# Patient Record
Sex: Male | Born: 1940 | Race: White | Hispanic: No | Marital: Married | Smoking: Former smoker
Health system: Southern US, Community
[De-identification: ages and names within clinical notes are randomized; demographics above are authoritative.]

## PROBLEM LIST (undated history)

## (undated) DIAGNOSIS — Z923 Personal history of irradiation: Secondary | ICD-10-CM

## (undated) DIAGNOSIS — C801 Malignant (primary) neoplasm, unspecified: Secondary | ICD-10-CM

## (undated) DIAGNOSIS — R590 Localized enlarged lymph nodes: Secondary | ICD-10-CM

## (undated) DIAGNOSIS — Z973 Presence of spectacles and contact lenses: Secondary | ICD-10-CM

## (undated) DIAGNOSIS — C61 Malignant neoplasm of prostate: Secondary | ICD-10-CM

## (undated) DIAGNOSIS — R0602 Shortness of breath: Secondary | ICD-10-CM

## (undated) DIAGNOSIS — M1990 Unspecified osteoarthritis, unspecified site: Secondary | ICD-10-CM

## (undated) DIAGNOSIS — C7951 Secondary malignant neoplasm of bone: Secondary | ICD-10-CM

## (undated) DIAGNOSIS — J189 Pneumonia, unspecified organism: Secondary | ICD-10-CM

## (undated) DIAGNOSIS — E785 Hyperlipidemia, unspecified: Secondary | ICD-10-CM

## (undated) HISTORY — PX: TONSILLECTOMY: SUR1361

## (undated) HISTORY — DX: Personal history of irradiation: Z92.3

## (undated) HISTORY — DX: Secondary malignant neoplasm of bone: C79.51

---

## 1970-03-29 HISTORY — PX: HERNIA REPAIR: SHX51

## 1997-03-29 HISTORY — PX: KNEE ARTHROSCOPY: SUR90

## 1999-03-30 HISTORY — PX: KNEE ARTHROSCOPY: SUR90

## 1999-05-21 ENCOUNTER — Encounter: Admission: RE | Admit: 1999-05-21 | Discharge: 1999-05-21 | Payer: Self-pay | Admitting: Orthopedic Surgery

## 1999-05-21 ENCOUNTER — Encounter: Payer: Self-pay | Admitting: Orthopedic Surgery

## 1999-06-22 ENCOUNTER — Ambulatory Visit (HOSPITAL_BASED_OUTPATIENT_CLINIC_OR_DEPARTMENT_OTHER): Admission: RE | Admit: 1999-06-22 | Discharge: 1999-06-22 | Payer: Self-pay | Admitting: Orthopedic Surgery

## 2003-03-30 DIAGNOSIS — C61 Malignant neoplasm of prostate: Secondary | ICD-10-CM

## 2003-03-30 HISTORY — PX: TRANSPERINEAL IMPLANT OF RADIATION SEEDS W/ ULTRASOUND: SUR1381

## 2003-03-30 HISTORY — DX: Malignant neoplasm of prostate: C61

## 2003-08-27 ENCOUNTER — Ambulatory Visit: Admission: RE | Admit: 2003-08-27 | Discharge: 2003-11-22 | Payer: Self-pay | Admitting: Radiation Oncology

## 2003-09-03 ENCOUNTER — Encounter: Admission: RE | Admit: 2003-09-03 | Discharge: 2003-09-03 | Payer: Self-pay | Admitting: Urology

## 2003-10-03 ENCOUNTER — Ambulatory Visit (HOSPITAL_COMMUNITY): Admission: RE | Admit: 2003-10-03 | Discharge: 2003-10-03 | Payer: Self-pay | Admitting: Urology

## 2003-10-03 ENCOUNTER — Ambulatory Visit (HOSPITAL_BASED_OUTPATIENT_CLINIC_OR_DEPARTMENT_OTHER): Admission: RE | Admit: 2003-10-03 | Discharge: 2003-10-03 | Payer: Self-pay | Admitting: Urology

## 2006-11-16 ENCOUNTER — Encounter: Admission: RE | Admit: 2006-11-16 | Discharge: 2006-11-16 | Payer: Self-pay | Admitting: Sports Medicine

## 2007-10-22 ENCOUNTER — Emergency Department (HOSPITAL_COMMUNITY): Admission: EM | Admit: 2007-10-22 | Discharge: 2007-10-23 | Payer: Self-pay | Admitting: Emergency Medicine

## 2009-04-02 ENCOUNTER — Ambulatory Visit (HOSPITAL_COMMUNITY): Admission: RE | Admit: 2009-04-02 | Discharge: 2009-04-02 | Payer: Self-pay | Admitting: Urology

## 2010-06-10 ENCOUNTER — Other Ambulatory Visit (INDEPENDENT_AMBULATORY_CARE_PROVIDER_SITE_OTHER): Payer: Self-pay

## 2010-06-10 DIAGNOSIS — R0989 Other specified symptoms and signs involving the circulatory and respiratory systems: Secondary | ICD-10-CM

## 2010-08-14 NOTE — Op Note (Signed)
[place]  Patient:    Donald Blair, Donald Blair                    MRN: [id number] Proc. Date: 06/22/99 Adm. Date:  [id number] Attending:  Twana First                           Operative Report  PREOPERATIVE DIAGNOSIS:  Left knee medial meniscus tear.  POSTOPERATIVE DIAGNOSIS: 1. Left knee medial meniscus tear. 2. 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  OPERATION PERFORMED:  Left knee examination under anesthesia followed by arthroscopic partial medial meniscectomy with chondroplasty.  SURGEON:  Elana Alm. Thurston Hole, M.D.  ASSISTANT:  Kirstin Adelberger, P.A.  ANESTHESIA:  Local with MAC.  OPERATIVE TIME:  30 minutes.  COMPLICATIONS:  None.  INDICATIONS FOR PROCEDURE:  The patient is a 70 year old gentleman who has had increasing left knee pain for the past eight to nine months with signs and symptoms and an MRI documenting a medial meniscus tear and early degenerative joint disease that has failed conservative care and is now to undergo arthroscopy.  DESCRIPTION OF PROCEDURE:  Mr. Limb was brought to the operating room on June 22, 1999 after a block had been placed in the holding room.  Placed on the operating table in supine position.  The left knee was examined under anesthesia. Range of motion 0 to 125 degrees, 1+ crepitation, mild varus deformity.  Knee stable otherwise to ligamentous exam, anterior posterior varus valgus stress, normal patellar tracking.  After this was done, the knee was prepped using sterile Betadine and draped using sterile technique.  Originally through an inferior portal the arthroscope with a pump attached was placed and through an inferior medial portal an arthroscopic probe was placed.  On initial inspection of the medial compartment, he was found to have 75% grade 3 chondromalacia of the medial femoral condyle which was debrided.  The medial tibial plateau showed only  20% grade 3 changes, the rest grade 1 to 2 changes.  The medial meniscus showed tearing posterior medial horn of which 40 to 50% was resected back to a stable rim. The intercondylar notch inspected.  The anterior and posterior cruciate ligaments were found to be normal.  Lateral compartment inspected.  Articular cartilage, lateral femoral condyle and lateral tibial plateau was normal.  Lateral meniscus was normal.  The patellofemoral joint showed 30% grade 3 chondromalacia which was debrided.  The patella tracked normally.  Significant synovitis medially and laterally was debrided and then cauterized.  Otherwise the medial and lateral gutters were free of pathology.  After this was done, it was felt that all pathology had been satisfactorily addressed.  The instruments were removed. Portals were closed with 3-0 nylon suture and injected with 0.25% Marcaine with  epinephrine and 5 mg of morphine.  Sterile dressing was applied and the patient  awakened and taken to the recovery room in stable condition.  FOLLOW-UP:  Mr. Matuszak will be followed as an outpatient on Vicodin and Naprosyn. See him back in the office in a week for suture removal and follow-up. DD:  06/22/99 TD:  06/22/99 Job: [id number]

## 2010-08-14 NOTE — Op Note (Signed)
NAME:  Donald Blair, Donald Blair                       ACCOUNT NO.:  [id number]   MEDICAL RECORD NO.:  [id number]                   PATIENT TYPE:  AMB   LOCATION:  [facility]                                 FACILITY:  [facility]   PHYSICIAN:  Claudette Laws, M.D.               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   POSTOPERATIVE DIAGNOSIS:  Clinical stage T1C Gleason grade 7 carcinoma of  the prostate gland.   OPERATION:  1. Transperineal radioactive seed implantation to the prostate gland.  2. Cystoscopy.   SURGEON:  Claudette Laws, M.D.   DESCRIPTION OF PROCEDURE:  The patient was prepped and draped in the dorsal  lithotomy position under intubated general anesthesia.  Foley catheter was  placed as was a Robinson catheter per rectum.  Dr. Chipper Herb, the  radiation oncologist, placed the transrectal probe, and the prostate was  then contoured.  We then placed anchor needles at C3.0 and E3.0.  We then  proceeded to implant the needles according to the study.  There were a total  of 18 needles for a total of 88 seeds.  The procedure went well using  ultrasound and C-arm for control purposes.  At the conclusion, the catheter  was removed.  Rigid cystoscopy was performed showing a normal anterior  urethra, enlarged prostate consistent with BPH and a normal bladder.  No  tumors no calculi, and normal ureteral orifices, and no seeds were in the  bladder.  We then replaced the Foley 16 [place], hooked it to a catheter, and  the patient was then taken back to the recovery room in satisfactory  condition.                                               Claudette Laws, M.D.    RFS/MEDQ  D:  10/03/2003  T:  10/03/2003  Job:  [id number]

## 2010-12-25 LAB — POCT I-STAT, CHEM 8
BUN: 17
Calcium, Ion: 1.3
Chloride: 103
Creatinine, Ser: 1.1
Glucose, Bld: 104 — ABNORMAL HIGH
HCT: 50
Hemoglobin: 17
Potassium: 4
Sodium: 139
TCO2: 28

## 2010-12-25 LAB — DIFFERENTIAL
Basophils Absolute: 0
Basophils Relative: 0
Eosinophils Absolute: 0.3
Eosinophils Relative: 2
Lymphocytes Relative: 10 — ABNORMAL LOW
Lymphs Abs: 1.4
Monocytes Absolute: 0.6
Monocytes Relative: 5
Neutro Abs: 11.6 — ABNORMAL HIGH
Neutrophils Relative %: 83 — ABNORMAL HIGH

## 2010-12-25 LAB — BASIC METABOLIC PANEL
BUN: 14
CO2: 28
Calcium: 10
Chloride: 106
Creatinine, Ser: 0.93
GFR calc Af Amer: >60
GFR calc non Af Amer: >60
Glucose, Bld: 103 — ABNORMAL HIGH
Potassium: 4.2
Sodium: 139

## 2010-12-25 LAB — HEPATIC FUNCTION PANEL
ALT: 26
AST: 21
Albumin: 3.9
Alkaline Phosphatase: 58
Bilirubin, Direct: 0.3
Indirect Bilirubin: 0.8
Total Bilirubin: 1.1
Total Protein: 6.8

## 2010-12-25 LAB — CBC
MCHC: 33.5
MCV: 92
Platelets: 218
RBC: 5.35

## 2010-12-25 LAB — LIPASE, BLOOD: Lipase: 27

## 2011-07-12 ENCOUNTER — Other Ambulatory Visit (HOSPITAL_COMMUNITY): Payer: Self-pay | Admitting: Urology

## 2011-07-12 DIAGNOSIS — C61 Malignant neoplasm of prostate: Secondary | ICD-10-CM

## 2011-07-27 ENCOUNTER — Ambulatory Visit (HOSPITAL_COMMUNITY)
Admission: RE | Admit: 2011-07-27 | Discharge: 2011-07-27 | Disposition: A | Discharge status: Home / Self Care | Payer: Medicare Other | Source: Ambulatory Visit | Attending: Urology | Admitting: Urology

## 2011-07-27 ENCOUNTER — Encounter (HOSPITAL_COMMUNITY)
Admission: RE | Admit: 2011-07-27 | Discharge: 2011-07-27 | Disposition: A | Discharge status: Home / Self Care | Payer: 59 | Source: Ambulatory Visit | Attending: Urology | Admitting: Urology

## 2011-07-27 ENCOUNTER — Other Ambulatory Visit (HOSPITAL_COMMUNITY): Payer: Self-pay | Admitting: Urology

## 2011-07-27 DIAGNOSIS — C7951 Secondary malignant neoplasm of bone: Secondary | ICD-10-CM | POA: Insufficient documentation

## 2011-07-27 DIAGNOSIS — R0602 Shortness of breath: Secondary | ICD-10-CM | POA: Insufficient documentation

## 2011-07-27 DIAGNOSIS — C61 Malignant neoplasm of prostate: Secondary | ICD-10-CM

## 2011-07-27 DIAGNOSIS — Z006 Encounter for examination for normal comparison and control in clinical research program: Secondary | ICD-10-CM | POA: Insufficient documentation

## 2011-07-27 DIAGNOSIS — Z01818 Encounter for other preprocedural examination: Secondary | ICD-10-CM | POA: Insufficient documentation

## 2011-07-27 DIAGNOSIS — C7952 Secondary malignant neoplasm of bone marrow: Secondary | ICD-10-CM | POA: Insufficient documentation

## 2011-07-27 MED ORDER — TECHNETIUM TC 99M MEDRONATE IV KIT
25.0000 | PACK | Freq: Once | INTRAVENOUS | Status: AC | PRN
  Administered 2011-07-27: 25 via INTRAVENOUS

## 2011-07-28 ENCOUNTER — Encounter: Payer: Self-pay | Admitting: Internal Medicine

## 2011-08-11 ENCOUNTER — Encounter: Payer: Self-pay | Admitting: Internal Medicine

## 2011-09-21 ENCOUNTER — Other Ambulatory Visit (HOSPITAL_COMMUNITY): Payer: Self-pay | Admitting: Urology

## 2011-09-21 DIAGNOSIS — C61 Malignant neoplasm of prostate: Secondary | ICD-10-CM

## 2011-10-27 ENCOUNTER — Ambulatory Visit (HOSPITAL_COMMUNITY): Payer: Self-pay

## 2011-10-27 ENCOUNTER — Encounter (HOSPITAL_COMMUNITY)
Admission: RE | Admit: 2011-10-27 | Discharge: 2011-10-27 | Disposition: A | Discharge status: Home / Self Care | Payer: 59 | Source: Ambulatory Visit | Attending: Urology | Admitting: Urology

## 2011-10-27 ENCOUNTER — Encounter (HOSPITAL_COMMUNITY): Payer: Self-pay

## 2011-10-27 DIAGNOSIS — M899 Disorder of bone, unspecified: Secondary | ICD-10-CM | POA: Insufficient documentation

## 2011-10-27 DIAGNOSIS — M949 Disorder of cartilage, unspecified: Secondary | ICD-10-CM | POA: Insufficient documentation

## 2011-10-27 DIAGNOSIS — C61 Malignant neoplasm of prostate: Secondary | ICD-10-CM | POA: Insufficient documentation

## 2011-10-27 MED ORDER — TECHNETIUM TC 99M MEDRONATE IV KIT
24.0000 | PACK | Freq: Once | INTRAVENOUS | Status: AC | PRN
  Administered 2011-10-27: 24 via INTRAVENOUS

## 2011-11-10 ENCOUNTER — Other Ambulatory Visit (HOSPITAL_COMMUNITY): Payer: Self-pay | Admitting: Urology

## 2011-11-10 DIAGNOSIS — C61 Malignant neoplasm of prostate: Secondary | ICD-10-CM

## 2012-01-19 ENCOUNTER — Encounter (HOSPITAL_COMMUNITY)
Admission: RE | Admit: 2012-01-19 | Discharge: 2012-01-19 | Disposition: A | Discharge status: Home / Self Care | Payer: Medicare Other | Source: Ambulatory Visit | Attending: Urology | Admitting: Urology

## 2012-01-19 ENCOUNTER — Encounter (HOSPITAL_COMMUNITY): Payer: Self-pay

## 2012-01-19 DIAGNOSIS — C61 Malignant neoplasm of prostate: Secondary | ICD-10-CM | POA: Insufficient documentation

## 2012-01-19 MED ORDER — TECHNETIUM TC 99M MEDRONATE IV KIT
24.0000 | PACK | Freq: Once | INTRAVENOUS | Status: AC | PRN
  Administered 2012-01-19: 24 via INTRAVENOUS

## 2012-02-02 ENCOUNTER — Other Ambulatory Visit (HOSPITAL_COMMUNITY): Payer: Self-pay | Admitting: Urology

## 2012-02-02 DIAGNOSIS — C61 Malignant neoplasm of prostate: Secondary | ICD-10-CM

## 2012-04-12 ENCOUNTER — Encounter (HOSPITAL_COMMUNITY)
Admission: RE | Admit: 2012-04-12 | Discharge: 2012-04-12 | Disposition: A | Discharge status: Home / Self Care | Payer: 59 | Source: Ambulatory Visit | Attending: Urology | Admitting: Urology

## 2012-04-12 ENCOUNTER — Encounter (HOSPITAL_COMMUNITY)
Admission: RE | Admit: 2012-04-12 | Discharge: 2012-04-12 | Disposition: A | Discharge status: Home / Self Care | Payer: Self-pay | Source: Ambulatory Visit | Attending: Urology | Admitting: Urology

## 2012-04-12 DIAGNOSIS — C61 Malignant neoplasm of prostate: Secondary | ICD-10-CM | POA: Insufficient documentation

## 2012-04-12 MED ORDER — TECHNETIUM TC 99M MEDRONATE IV KIT
25.0000 | PACK | Freq: Once | INTRAVENOUS | Status: AC | PRN
  Administered 2012-04-12: 25 via INTRAVENOUS

## 2012-05-16 ENCOUNTER — Other Ambulatory Visit (HOSPITAL_COMMUNITY): Payer: Self-pay | Admitting: Urology

## 2012-05-16 DIAGNOSIS — C61 Malignant neoplasm of prostate: Secondary | ICD-10-CM

## 2012-07-12 ENCOUNTER — Encounter (HOSPITAL_COMMUNITY): Payer: 59

## 2012-09-19 ENCOUNTER — Other Ambulatory Visit: Payer: Self-pay | Admitting: Orthopedic Surgery

## 2012-09-27 ENCOUNTER — Encounter (HOSPITAL_BASED_OUTPATIENT_CLINIC_OR_DEPARTMENT_OTHER): Payer: Self-pay | Admitting: *Deleted

## 2012-09-27 NOTE — Progress Notes (Signed)
09/27/12 1019  OBSTRUCTIVE SLEEP APNEA  Have you ever been diagnosed with sleep apnea through a sleep study? No  Do you snore loudly (loud enough to be heard through closed doors)?  1  Do you often feel tired, fatigued, or sleepy during the daytime? 0  Has anyone observed you stop breathing during your sleep? 0  Do you have, or are you being treated for high blood pressure? 0  BMI more than 35 kg/m2? 1  Age over 74 years old? 1  Gender: 1  Obstructive Sleep Apnea Score 4  Score 4 or greater  Results sent to PCP

## 2012-09-27 NOTE — Progress Notes (Signed)
No cardiav or resp problems-some sob due to overweight To bring all meds and overnight bag

## 2012-10-02 NOTE — H&P (Signed)
Donald Blair is an 72 y.o. male.   Chief Complaint: c/o chronic and progressive pain and weakness right shoulder HPI: Donald Blair is a 72 year old retired gentleman referred by Dr. Quita Blair office for evaluation and management of his impaired right arm. 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 He is in the middle of treatment for recurrent prostate cancer. He had prostate cancer diagnosed in 2002 and had seed implants. Unfortunately he has had recurrence. He has been treated with Provenge immunotherapy. His urologist is Donald Blair. He sustained a significant injury to his right arm in a fall in mid January 2014. He had a significant direct blow to the right elbow region and load bearing to the shoulder. He had pain in the shoulder and inability to abduct or forward flex the shoulder since the time of injury. He has gradually regained some motion. He now seeks an upper extremity orthopaedic consult.   Past Medical History  Diagnosis Date  . Cancer     prostate  . Hyperlipidemia   . Wears glasses   . Shortness of breath   . Arthritis     Past Surgical History  Procedure Laterality Date  . Tonsillectomy    . Hernia repair  1972    lt ing  . Knee arthroscopy  1999    right  . Knee arthroscopy  2001    left  . Transperineal implant of radiation seeds w/ ultrasound  2005    prostate    History reviewed. No pertinent family history. Social History:  reports that he has quit smoking. He quit smokeless tobacco use about 40 years ago. He reports that he does not drink alcohol or use illicit drugs.  Allergies: No Known Allergies  No prescriptions prior to admission    No results found for this or any previous visit (from the past 48 hour(s)).  No results found.   Pertinent items are noted in HPI.  Height 6' (1.829 m), weight 127.007 kg (280 lb).  General appearance: alert Head: Normocephalic, without obvious  abnormality Neck: supple, symmetrical, trachea midline Resp: clear to auscultation bilaterally Cardio: regular rate and rhythm GI: normal findings: bowel sounds normal Extremities:  His shoulder ROM reveals impairment of elevation right shoulder at 150 degrees vs 170 on the left. He has external rotation 80 degrees at 90 degrees abduction vs 90 degrees on the left. He can internally rotate to L4 vs approximately T12 on the left. He has weakness of scaption. He has a positive drop sign, weakness of external rotation at neutral and at 90 degrees abduction.   He had x-rays at the [facility] practice which revealed no sign of fracture.   The MRI was obtained at [facility] on 09/12/12. This reveals a large right rotator cuff tear involving the supraspinatus, infraspinatus tendons, it is retracted.   He has an intact subscapularis and mildly unfavorable AC anatomy.    Pulses: 2+ and symmetric Skin: normal Neurologic: Grossly normal    Assessment/Plan Impression: Right shoulder impingement with RC tear confirmed by MRI.  Plan: To the OR for right SA with SAD/DCR and repair RC as needed.The procedure, risks,benefits and post-op course were discussed with the patient at length and they were in agreement with the plan.  Donald Blair 10/02/2012, 4:01 PM   H&P documentation: 10/03/2012  -History and Physical Reviewed  -Patient has been re-examined  -No change in the plan of care  Donald Forster, MD

## 2012-10-03 ENCOUNTER — Encounter (HOSPITAL_BASED_OUTPATIENT_CLINIC_OR_DEPARTMENT_OTHER)
Admission: RE | Disposition: A | Discharge status: Home / Self Care | Payer: Self-pay | Source: Ambulatory Visit | Attending: Orthopedic Surgery

## 2012-10-03 ENCOUNTER — Encounter (HOSPITAL_BASED_OUTPATIENT_CLINIC_OR_DEPARTMENT_OTHER): Payer: Self-pay | Admitting: *Deleted

## 2012-10-03 ENCOUNTER — Ambulatory Visit (HOSPITAL_BASED_OUTPATIENT_CLINIC_OR_DEPARTMENT_OTHER): Payer: Medicare Other | Admitting: Certified Registered"

## 2012-10-03 ENCOUNTER — Encounter (HOSPITAL_BASED_OUTPATIENT_CLINIC_OR_DEPARTMENT_OTHER): Payer: Self-pay | Admitting: Certified Registered"

## 2012-10-03 ENCOUNTER — Ambulatory Visit (HOSPITAL_BASED_OUTPATIENT_CLINIC_OR_DEPARTMENT_OTHER)
Admission: RE | Admit: 2012-10-03 | Discharge: 2012-10-04 | Disposition: A | Discharge status: Home / Self Care | Payer: Medicare Other | Source: Ambulatory Visit | Attending: Orthopedic Surgery | Admitting: Orthopedic Surgery

## 2012-10-03 DIAGNOSIS — M9420 Chondromalacia, unspecified site: Secondary | ICD-10-CM | POA: Insufficient documentation

## 2012-10-03 DIAGNOSIS — Z923 Personal history of irradiation: Secondary | ICD-10-CM | POA: Insufficient documentation

## 2012-10-03 DIAGNOSIS — M129 Arthropathy, unspecified: Secondary | ICD-10-CM | POA: Insufficient documentation

## 2012-10-03 DIAGNOSIS — Z6838 Body mass index (BMI) 38.0-38.9, adult: Secondary | ICD-10-CM | POA: Insufficient documentation

## 2012-10-03 DIAGNOSIS — C61 Malignant neoplasm of prostate: Secondary | ICD-10-CM | POA: Insufficient documentation

## 2012-10-03 DIAGNOSIS — W19XXXA Unspecified fall, initial encounter: Secondary | ICD-10-CM | POA: Insufficient documentation

## 2012-10-03 DIAGNOSIS — Z87891 Personal history of nicotine dependence: Secondary | ICD-10-CM | POA: Insufficient documentation

## 2012-10-03 DIAGNOSIS — E785 Hyperlipidemia, unspecified: Secondary | ICD-10-CM | POA: Insufficient documentation

## 2012-10-03 DIAGNOSIS — M25819 Other specified joint disorders, unspecified shoulder: Secondary | ICD-10-CM | POA: Insufficient documentation

## 2012-10-03 DIAGNOSIS — S43429A Sprain of unspecified rotator cuff capsule, initial encounter: Secondary | ICD-10-CM | POA: Insufficient documentation

## 2012-10-03 DIAGNOSIS — M19019 Primary osteoarthritis, unspecified shoulder: Secondary | ICD-10-CM | POA: Insufficient documentation

## 2012-10-03 HISTORY — DX: Hyperlipidemia, unspecified: E78.5

## 2012-10-03 HISTORY — DX: Unspecified osteoarthritis, unspecified site: M19.90

## 2012-10-03 HISTORY — PX: SHOULDER ARTHROSCOPY WITH ROTATOR CUFF REPAIR: SHX5685

## 2012-10-03 HISTORY — DX: Shortness of breath: R06.02

## 2012-10-03 HISTORY — DX: Presence of spectacles and contact lenses: Z97.3

## 2012-10-03 HISTORY — DX: Malignant (primary) neoplasm, unspecified: C80.1

## 2012-10-03 SURGERY — ARTHROSCOPY, SHOULDER, WITH ROTATOR CUFF REPAIR
Anesthesia: General | Site: Shoulder | Laterality: Right | Wound class: Clean

## 2012-10-03 MED ORDER — LIDOCAINE HCL 4 % MT SOLN
OROMUCOSAL | Status: DC | PRN
  Administered 2012-10-03: 4 mL via TOPICAL

## 2012-10-03 MED ORDER — ONDANSETRON HCL 4 MG PO TABS: 4.0000 mg | ORAL_TABLET | Freq: Four times a day (QID) | ORAL | Status: DC | PRN

## 2012-10-03 MED ORDER — HYDROMORPHONE HCL PF 1 MG/ML IJ SOLN
0.2500 mg | INTRAMUSCULAR | Status: AC | PRN
  Administered 2012-10-03 – 2012-10-04 (×9): 0.5 mg via INTRAVENOUS

## 2012-10-03 MED ORDER — OXYCODONE HCL 5 MG PO TABS: 5.0000 mg | ORAL_TABLET | Freq: Once | ORAL | Status: AC | PRN

## 2012-10-03 MED ORDER — ONDANSETRON HCL 4 MG/2ML IJ SOLN: 4.0000 mg | Freq: Four times a day (QID) | INTRAMUSCULAR | Status: DC | PRN

## 2012-10-03 MED ORDER — HYDROMORPHONE HCL 2 MG PO TABS
2.0000 mg | ORAL_TABLET | ORAL | Status: DC | PRN
  Administered 2012-10-03 – 2012-10-04 (×4): 2 mg via ORAL

## 2012-10-03 MED ORDER — HYDROMORPHONE HCL 2 MG PO TABS: 2.0000 mg | ORAL_TABLET | ORAL | Status: DC | PRN

## 2012-10-03 MED ORDER — OXYCODONE HCL 5 MG/5ML PO SOLN: 5.0000 mg | Freq: Once | ORAL | Status: AC | PRN

## 2012-10-03 MED ORDER — METOCLOPRAMIDE HCL 5 MG PO TABS: 5.0000 mg | ORAL_TABLET | Freq: Three times a day (TID) | ORAL | Status: DC | PRN

## 2012-10-03 MED ORDER — PROPOFOL 10 MG/ML IV BOLUS
INTRAVENOUS | Status: DC | PRN
  Administered 2012-10-03: 50 mg via INTRAVENOUS
  Administered 2012-10-03: 200 mg via INTRAVENOUS

## 2012-10-03 MED ORDER — CEPHALEXIN 500 MG PO CAPS: 500.0000 mg | ORAL_CAPSULE | Freq: Three times a day (TID) | ORAL | Status: DC

## 2012-10-03 MED ORDER — MIDAZOLAM HCL 2 MG/2ML IJ SOLN
1.0000 mg | INTRAMUSCULAR | Status: DC | PRN
  Administered 2012-10-03: 2 mg via INTRAVENOUS

## 2012-10-03 MED ORDER — DEXAMETHASONE SODIUM PHOSPHATE 4 MG/ML IJ SOLN
INTRAMUSCULAR | Status: DC | PRN
  Administered 2012-10-03: 8 mg via INTRAVENOUS

## 2012-10-03 MED ORDER — METOCLOPRAMIDE HCL 5 MG/ML IJ SOLN: 5.0000 mg | Freq: Three times a day (TID) | INTRAMUSCULAR | Status: DC | PRN

## 2012-10-03 MED ORDER — CEFAZOLIN SODIUM-DEXTROSE 2-3 GM-% IV SOLR: 2.0000 g | Freq: Four times a day (QID) | INTRAVENOUS | Status: DC

## 2012-10-03 MED ORDER — FENTANYL CITRATE 0.05 MG/ML IJ SOLN
50.0000 ug | INTRAMUSCULAR | Status: DC | PRN
  Administered 2012-10-03: 100 ug via INTRAVENOUS

## 2012-10-03 MED ORDER — SODIUM CHLORIDE 0.9 % IV SOLN
INTRAVENOUS | Status: DC
  Administered 2012-10-03: 20 mL/h via INTRAVENOUS

## 2012-10-03 MED ORDER — SODIUM CHLORIDE 0.9 % IR SOLN
Status: DC | PRN
  Administered 2012-10-03: 8000 mL

## 2012-10-03 MED ORDER — LIDOCAINE HCL 1 % IJ SOLN
INTRAMUSCULAR | Status: DC | PRN
  Administered 2012-10-03: 2 mL via INTRADERMAL

## 2012-10-03 MED ORDER — LIDOCAINE HCL 1 % IJ SOLN: INTRAMUSCULAR | Status: DC | PRN

## 2012-10-03 MED ORDER — METOCLOPRAMIDE HCL 5 MG/ML IJ SOLN: 10.0000 mg | Freq: Once | INTRAMUSCULAR | Status: AC | PRN

## 2012-10-03 MED ORDER — SUCCINYLCHOLINE CHLORIDE 20 MG/ML IJ SOLN
INTRAMUSCULAR | Status: DC | PRN
  Administered 2012-10-03: 140 mg via INTRAVENOUS

## 2012-10-03 MED ORDER — PHENYLEPHRINE HCL 10 MG/ML IJ SOLN
10.0000 mg | INTRAVENOUS | Status: DC | PRN
  Administered 2012-10-03: 30 ug/min via INTRAVENOUS

## 2012-10-03 MED ORDER — LACTATED RINGERS IV SOLN
INTRAVENOUS | Status: DC
  Administered 2012-10-03 (×2): via INTRAVENOUS

## 2012-10-03 MED ORDER — FENTANYL CITRATE 0.05 MG/ML IJ SOLN
INTRAMUSCULAR | Status: DC | PRN
  Administered 2012-10-03 (×2): 25 ug via INTRAVENOUS

## 2012-10-03 MED ORDER — ROPIVACAINE HCL 5 MG/ML IJ SOLN
INTRAMUSCULAR | Status: DC | PRN
  Administered 2012-10-03: 15 mL

## 2012-10-03 MED ORDER — CEFAZOLIN SODIUM-DEXTROSE 2-3 GM-% IV SOLR
2.0000 g | Freq: Four times a day (QID) | INTRAVENOUS | Status: AC
  Administered 2012-10-03: 2 g via INTRAVENOUS

## 2012-10-03 MED ORDER — CEFAZOLIN SODIUM-DEXTROSE 2-3 GM-% IV SOLR
2.0000 g | INTRAVENOUS | Status: AC
  Administered 2012-10-03: 2 g via INTRAVENOUS

## 2012-10-03 MED ORDER — CEFAZOLIN SODIUM-DEXTROSE 2-3 GM-% IV SOLR
2.0000 g | Freq: Four times a day (QID) | INTRAVENOUS | Status: DC
  Administered 2012-10-03: 2 g via INTRAVENOUS

## 2012-10-03 MED ORDER — ONDANSETRON HCL 4 MG/2ML IJ SOLN
INTRAMUSCULAR | Status: DC | PRN
  Administered 2012-10-03: 4 mg via INTRAVENOUS

## 2012-10-03 MED ORDER — CHLORHEXIDINE GLUCONATE 4 % EX LIQD: 60.0000 mL | Freq: Once | CUTANEOUS | Status: DC

## 2012-10-03 SURGICAL SUPPLY — 92 items
ANCH SUT PUSHLCK 24X4.5 STRL (Orthopedic Implant) ×1 IMPLANT
ANCH SUT SWLK 19.1 CLS EYLT TL (Anchor) ×1 IMPLANT
ANCH SUT SWLK 19.1 CLS EYLT VT (Anchor) ×1 IMPLANT
ANCH SUT SWLK 19.1X4.75 (Anchor) ×2 IMPLANT
ANCHOR BIO SWLOCK 4.75 W/TIG (Anchor) ×1 IMPLANT
ANCHOR SUT BIO SW 4.75 W/FIB (Anchor) ×1 IMPLANT
ANCHOR SUT BIO SW 4.75X19.1 (Anchor) ×2 IMPLANT
BANDAGE ADHESIVE 1X3 (GAUZE/BANDAGES/DRESSINGS) IMPLANT
BLADE AVERAGE 25X9 (BLADE) IMPLANT
BLADE CUTTER MENIS 5.5 (BLADE) IMPLANT
BLADE SURG 15 STRL LF DISP TIS (BLADE) ×2 IMPLANT
BLADE SURG 15 STRL SS (BLADE) ×4
BUR EGG 3PK/BX (BURR) ×1 IMPLANT
BUR OVAL 6.0 (BURR) ×2 IMPLANT
CANISTER OMNI JUG 16 LITER (MISCELLANEOUS) ×2 IMPLANT
CANISTER SUCTION 2500CC (MISCELLANEOUS) ×1 IMPLANT
CANNULA SHOULDER 7CM (CANNULA) IMPLANT
CANNULA TWIST IN 8.25X7CM (CANNULA) IMPLANT
CLEANER CAUTERY TIP 5X5 PAD (MISCELLANEOUS) IMPLANT
CLOTH BEACON ORANGE TIMEOUT ST (SAFETY) ×2 IMPLANT
CUTTER MENISCUS  4.2MM (BLADE) ×1
CUTTER MENISCUS 4.2MM (BLADE) ×1 IMPLANT
DECANTER SPIKE VIAL GLASS SM (MISCELLANEOUS) IMPLANT
DRAPE INCISE IOBAN 66X45 STRL (DRAPES) ×2 IMPLANT
DRAPE STERI 35X30 U-POUCH (DRAPES) ×2 IMPLANT
DRAPE SURG 17X23 STRL (DRAPES) ×2 IMPLANT
DRAPE U-SHAPE 47X51 STRL (DRAPES) ×2 IMPLANT
DRAPE U-SHAPE 76X120 STRL (DRAPES) ×4 IMPLANT
DRSG PAD ABDOMINAL 8X10 ST (GAUZE/BANDAGES/DRESSINGS) ×2 IMPLANT
DURAPREP 26ML APPLICATOR (WOUND CARE) ×2 IMPLANT
ELECT REM PT RETURN 9FT ADLT (ELECTROSURGICAL) ×2
ELECTRODE REM PT RTRN 9FT ADLT (ELECTROSURGICAL) IMPLANT
GLOVE BIO SURGEON STRL SZ 6.5 (GLOVE) ×3 IMPLANT
GLOVE BIOGEL M STRL SZ7.5 (GLOVE) ×2 IMPLANT
GLOVE BIOGEL PI IND STRL 6.5 (GLOVE) IMPLANT
GLOVE BIOGEL PI IND STRL 7.0 (GLOVE) IMPLANT
GLOVE BIOGEL PI IND STRL 8 (GLOVE) ×2 IMPLANT
GLOVE BIOGEL PI INDICATOR 6.5 (GLOVE) ×1
GLOVE BIOGEL PI INDICATOR 7.0 (GLOVE) ×3
GLOVE BIOGEL PI INDICATOR 8 (GLOVE) ×2
GLOVE EXAM NITRILE EXT CUFF MD (GLOVE) ×1 IMPLANT
GLOVE ORTHO TXT STRL SZ7.5 (GLOVE) ×2 IMPLANT
GLOVE SURG SS PI 7.0 STRL IVOR (GLOVE) ×1 IMPLANT
GOWN BRE IMP PREV XXLGXLNG (GOWN DISPOSABLE) ×4 IMPLANT
GOWN PREVENTION PLUS XLARGE (GOWN DISPOSABLE) ×5 IMPLANT
NDL SCORPION (NEEDLE) ×1 IMPLANT
NDL SUT 6 .5 CRC .975X.05 MAYO (NEEDLE) IMPLANT
NEEDLE MAYO TAPER (NEEDLE) ×2
NEEDLE MINI RC 24MM (NEEDLE) IMPLANT
NEEDLE SCORPION (NEEDLE) ×2 IMPLANT
PACK ARTHROSCOPY DSU (CUSTOM PROCEDURE TRAY) ×2 IMPLANT
PACK BASIN DAY SURGERY FS (CUSTOM PROCEDURE TRAY) ×2 IMPLANT
PAD CLEANER CAUTERY TIP 5X5 (MISCELLANEOUS)
PASSER SUT SWANSON 36MM LOOP (INSTRUMENTS) IMPLANT
PENCIL BUTTON HOLSTER BLD 10FT (ELECTRODE) ×1 IMPLANT
PUSHLOCK PEEK 4.5X24 (Orthopedic Implant) ×1 IMPLANT
SLEEVE SCD COMPRESS KNEE MED (MISCELLANEOUS) ×2 IMPLANT
SLING ARM FOAM STRAP LRG (SOFTGOODS) IMPLANT
SLING ARM FOAM STRAP MED (SOFTGOODS) IMPLANT
SLING ARM FOAM STRAP XLG (SOFTGOODS) ×1 IMPLANT
SPONGE GAUZE 4X4 12PLY (GAUZE/BANDAGES/DRESSINGS) ×2 IMPLANT
SPONGE LAP 4X18 X RAY DECT (DISPOSABLE) ×2 IMPLANT
STRIP CLOSURE SKIN 1/2X4 (GAUZE/BANDAGES/DRESSINGS) ×1 IMPLANT
SUCTION FRAZIER TIP 10 FR DISP (SUCTIONS) IMPLANT
SUT ETHIBOND 2 OS 4 DA (SUTURE) IMPLANT
SUT ETHILON 4 0 PS 2 18 (SUTURE) IMPLANT
SUT FIBERWIRE #2 38 T-5 BLUE (SUTURE)
SUT FIBERWIRE 3-0 18 TAPR NDL (SUTURE)
SUT PROLENE 1 CT (SUTURE) IMPLANT
SUT PROLENE 3 0 PS 2 (SUTURE) ×2 IMPLANT
SUT TIGER TAPE 7 IN WHITE (SUTURE) ×1 IMPLANT
SUT VIC AB 0 CT1 27 (SUTURE) ×2
SUT VIC AB 0 CT1 27XBRD ANBCTR (SUTURE) IMPLANT
SUT VIC AB 0 SH 27 (SUTURE) IMPLANT
SUT VIC AB 2-0 SH 27 (SUTURE) ×2
SUT VIC AB 2-0 SH 27XBRD (SUTURE) IMPLANT
SUT VIC AB 3-0 SH 27 (SUTURE)
SUT VIC AB 3-0 SH 27X BRD (SUTURE) IMPLANT
SUT VIC AB 3-0 X1 27 (SUTURE) IMPLANT
SUTURE FIBERWR #2 38 T-5 BLUE (SUTURE) IMPLANT
SUTURE FIBERWR 3-0 18 TAPR NDL (SUTURE) IMPLANT
SYR 3ML 23GX1 SAFETY (SYRINGE) IMPLANT
SYR BULB 3OZ (MISCELLANEOUS) ×1 IMPLANT
TAPE FIBER 2MM 7IN #2 BLUE (SUTURE) ×1 IMPLANT
TAPE PAPER 3X10 WHT MICROPORE (GAUZE/BANDAGES/DRESSINGS) ×2 IMPLANT
TOWEL OR 17X24 6PK STRL BLUE (TOWEL DISPOSABLE) ×2 IMPLANT
TUBE CONNECTING 20X1/4 (TUBING) ×3 IMPLANT
TUBING ARTHROSCOPY IRRIG 16FT (MISCELLANEOUS) ×1 IMPLANT
WAND 30 DEG SABER W/CORD (SURGICAL WAND) IMPLANT
WAND STAR VAC 90 (SURGICAL WAND) ×2 IMPLANT
WATER STERILE IRR 1000ML POUR (IV SOLUTION) ×2 IMPLANT
YANKAUER SUCT BULB TIP NO VENT (SUCTIONS) IMPLANT

## 2012-10-03 NOTE — Anesthesia Procedure Notes (Addendum)
Anesthesia Regional Block:  Interscalene brachial plexus block  Pre-Anesthetic Checklist: ,, timeout performed, Correct Patient, Correct Site, Correct Laterality, Correct Procedure, Correct Position, site marked, Risks and benefits discussed,  Surgical consent,  Pre-op evaluation,  At surgeon's request and post-op pain management  Laterality: Right  Prep: chloraprep       Needles:   Needle Type: Other     Needle Length: 9cm  Needle Gauge: 21    Additional Needles:  Procedures: ultrasound guided (picture in chart) Interscalene brachial plexus block Narrative:  Start time: 10/03/2012 10:46 AM End time: 10/03/2012 10:53 AM Injection made incrementally with aspirations every 5 mL.  Performed by: Personally  Anesthesiologist: Aldona Lento, MD  Additional Notes: Ultrasound guidance used to: id relevant anatomy, confirm needle position, local anesthetic spread, avoidance of vascular puncture. Picture saved. No complications. Block performed personally by Janetta Hora. Gelene Mink, MD    Interscalene brachial plexus block Procedure Name: Intubation Date/Time: 10/03/2012 11:09 AM Performed by: Verlan Friends Pre-anesthesia Checklist: Patient identified, Emergency Drugs available, Suction available, Patient being monitored and Timeout performed Patient Re-evaluated:Patient Re-evaluated prior to inductionOxygen Delivery Method: Circle System Utilized Preoxygenation: Pre-oxygenation with 100% oxygen Intubation Type: IV induction Ventilation: Mask ventilation without difficulty Laryngoscope Size: Miller and 3 Grade View: Grade I Tube type: Oral Tube size: 8.0 mm Number of attempts: 1 Airway Equipment and Method: stylet Placement Confirmation: ETT inserted through vocal cords under direct vision,  positive ETCO2 and breath sounds checked- equal and bilateral Secured at: 23 cm Tube secured with: Tape Dental Injury: Teeth and Oropharynx as per pre-operative assessment

## 2012-10-03 NOTE — Anesthesia Postprocedure Evaluation (Signed)
Anesthesia Post Note  Patient: Donald Blair  Procedure(s) Performed: Procedure(s) (LRB): RIGHT SHOULDER ARTHROSCOPY WITH OPEN ROTATOR CUFF REPAIR AND DISTAL CLAVICLE EXCISION (Right)  Anesthesia type: general  Patient location: PACU  Post pain: Pain level controlled  Post assessment: Patient's Cardiovascular Status Stable  Last Vitals:  Filed Vitals:   10/03/12 1430  BP: 142/84  Pulse: 89  Temp:   Resp: 18    Post vital signs: Reviewed and stable  Level of consciousness: sedated  Complications: No apparent anesthesia complications

## 2012-10-03 NOTE — Transfer of Care (Signed)
Immediate Anesthesia Transfer of Care Note  Patient: KAMANI LEWTER  Procedure(s) Performed: Procedure(s): RIGHT SHOULDER ARTHROSCOPY WITH OPEN ROTATOR CUFF REPAIR AND DISTAL CLAVICLE EXCISION (Right)  Patient Location: PACU  Anesthesia Type:GA combined with regional for post-op pain  Level of Consciousness: awake, alert , oriented and patient cooperative  Airway & Oxygen Therapy: Patient Spontanous Breathing and Patient connected to face mask oxygen  Post-op Assessment: Report given to PACU RN and Post -op Vital signs reviewed and stable  Post vital signs: Reviewed and stable  Complications: No apparent anesthesia complications

## 2012-10-03 NOTE — Progress Notes (Signed)
Assisted Dr. Frederick with right, ultrasound guided, interscalene  block. Side rails up, monitors on throughout procedure. See vital signs in flow sheet. Tolerated Procedure well. 

## 2012-10-03 NOTE — Op Note (Signed)
[id number] 

## 2012-10-03 NOTE — Brief Op Note (Signed)
10/03/2012  12:55 PM  PATIENT:  Sherrill Raring  72 y.o. male  PRE-OPERATIVE DIAGNOSIS:  RIGHT ROTATOR CUFF TEAR, AC DJD  POST-OPERATIVE DIAGNOSIS:  RIGHT ROTATOR CUFF TEAR, AC DJD  PROCEDURE:  Procedure(s): RIGHT SHOULDER ARTHROSCOPY WITH OPEN ROTATOR CUFF REPAIR AND DISTAL CLAVICLE EXCISION (Right)  SURGEON:  Surgeon(s) and Role:    * Wyn Forster., MD - Primary  PHYSICIAN ASSISTANT:   ASSISTANTS: Mallory Shirk.A-C    ANESTHESIA:   regional and general endotracheal  EBL:  Total I/O In: 1000 [I.V.:1000] Out: -   BLOOD ADMINISTERED:none  DRAINS: none   LOCAL MEDICATIONS USED:  Ropivacaine plexus block  SPECIMEN:  No Specimen  DISPOSITION OF SPECIMEN:  N/A  COUNTS:  YES  TOURNIQUET:  * No tourniquets in log *  DICTATION: .Other Dictation: Dictation Number [id number]  PLAN OF CARE: admit to PACU followed by overnight observation in recovery care center for 23 hours.  PATIENT DISPOSITION:  PACU - hemodynamically stable.   Delay start of Pharmacological VTE agent (>24hrs) due to surgical blood loss or risk of bleeding: not applicable

## 2012-10-03 NOTE — Anesthesia Preprocedure Evaluation (Addendum)
Anesthesia Evaluation  Patient identified by MRN, date of birth, ID band Patient awake    Reviewed: Allergy & Precautions, H&P , NPO status , Patient's Chart, lab work & pertinent test results, reviewed documented beta blocker date and time   Airway Mallampati: II TM Distance: >3 FB Neck ROM: full    Dental   Pulmonary shortness of breath and with exertion,  breath sounds clear to auscultation        Cardiovascular negative cardio ROS  Rhythm:regular     Neuro/Psych negative neurological ROS  negative psych ROS   GI/Hepatic negative GI ROS, Neg liver ROS,   Endo/Other  negative endocrine ROSMorbid obesity  Renal/GU negative Renal ROS  negative genitourinary   Musculoskeletal   Abdominal   Peds  Hematology negative hematology ROS (+)   Anesthesia Other Findings See surgeon's H&P   Reproductive/Obstetrics negative OB ROS                          Anesthesia Physical Anesthesia Plan  ASA: III  Anesthesia Plan: General   Post-op Pain Management:    Induction: Intravenous  Airway Management Planned: Oral ETT  Additional Equipment:   Intra-op Plan:   Post-operative Plan: Extubation in OR  Informed Consent: I have reviewed the patients History and Physical, chart, labs and discussed the procedure including the risks, benefits and alternatives for the proposed anesthesia with the patient or authorized representative who has indicated his/her understanding and acceptance.   Dental Advisory Given  Plan Discussed with: CRNA and Surgeon  Anesthesia Plan Comments:         Anesthesia Quick Evaluation

## 2012-10-04 ENCOUNTER — Encounter (HOSPITAL_BASED_OUTPATIENT_CLINIC_OR_DEPARTMENT_OTHER): Payer: Self-pay | Admitting: Orthopedic Surgery

## 2012-10-04 NOTE — Op Note (Signed)
Donald Blair, Donald Blair             ACCOUNT NO.:  [id number]  MEDICAL RECORD NO.:  [id number]  LOCATION:                               FACILITY:  [facility]  PHYSICIAN:  Katy Fitch. Alea Ryer, M.D. DATE OF BIRTH:  [birth year]  DATE OF PROCEDURE:  10/03/2012 DATE OF DISCHARGE:  10/03/2012                              OPERATIVE REPORT   PREOPERATIVE DIAGNOSES: 1. Retracted right rotator cuff tear involving supraspinatus,     infraspinatus, rotator cuff tendons. 2. Unfavorable acromioclavicular morphology with prominent anterior-     medial acromion and distal clavicle due to degenerative arthritis     in acromioclavicular joint.  POSTOPERATIVE DIAGNOSES: 1. Retracted right rotator cuff tear involving supraspinatus,     infraspinatus, rotator cuff tendons. 2. Unfavorable acromioclavicular morphology with prominent anterior-     medial acromion and distal clavicle due to degenerative arthritis     in acromioclavicular joint with additional diagnoses of grade 4     chondromalacia of glenoid and significant labral degenerative tear.  OPERATION: 1. Diagnostic arthroscopy, right glenohumeral joint. 2. Arthroscopic debridement of labrum, synovitis, and deep surface of     rotator cuff tear. 3. Arthroscopic subacromial decompression with acromioplasty, release     coracoacromial ligament, and bursectomy. 4. Arthroscopic resection distal clavicle. 5. Open reconstruction of supraspinatus and infraspinatus rotator cuff     chronic retracted tear.  OPERATING SURGEON:  Katy Fitch. Idell Hissong, MD  ASSISTANT:  Marveen Reeks. Dasnoit, PA-C.  ANESTHESIA:  General endotracheal supplemented by a right ropivacaine plexus block placed with ultrasound guidance by Dr. Justin Mend in the holding area.  ANESTHESIA:  General endotracheal supplemented by the ropivacaine plexus block.  SUPERVISING ANESTHESIOLOGIST:  Dr. Gelene Mink.  INDICATIONS:  Donald Blair is a 72 year old, right-hand-dominant gentleman who was  referred for evaluation and management of a painful and weak right arm.  In January 2014, he had a strain injury to the arm and had immediate pain, inability to abduct his arm.  He presented for evaluation in the spring of 2014, it was noted have a positive drop arm sign.  He was under treatment for metastatic prostate cancer by Dr. Laverle Patter, therefore immediate treatment of his rotator cuff tear was postponed until he was cleared by Dr. Laverle Patter for general anesthesia and surgery.  Clinical examination revealed marked weakness of abduction, external rotation, and positive drop-arm sign.  Plain films revealed AC degenerative arthritis and unfavorable acromial morphology.  He was sent for an MRI that documented retracted tears of the supraspinatus and infraspinatus rotator cuff tendons.  We advised proceeding with reconstruction when he was cleared by his medical and urologic doctors.  After informed consent, he was brought to the operating room at this time.  DESCRIPTION OF PROCEDURE:  Donald Blair was interviewed by Dr. Justin Mend in the holding area.  After detailed anesthesia informed consent, he selected general endotracheal anesthesia supplemented by a ropivacaine plexus block.  In the holding area with ultrasound guidance, Dr. Justin Mend placed a ropivacaine plexus block without complication leading to excellent anesthesia of the right upper extremity and forequarter.  Donald Blair was subsequently transferred to room 6 of the [facility] and placed in supine position on operating table.  Following  induction of general endotracheal anesthesia, he was carefully positioned in beach chair position with the aid of a torso and head holder designed for shoulder arthroscopy.  The entire right upper extremity and forequarter were prepped with DuraPrep and draped with impervious arthroscopy drapes.  The procedure commenced with instrumentation of the shoulder using anterior  switching stick technique.  Diagnostic arthroscopy of the glenohumeral joint revealed grade 4 chondromalacia of the central glenoid.  This was documented with the digital camera.  There were marked degenerative changes of the superior and anterior labrum, and moderate synovitis.  An anterior portal was created under direct vision followed by synovectomy and debridement of labrum and debridement of the deep surface of the rotator cuff tear.  There was noted to be a cuff tear extending from the rotator interval posteriorly to the posterior aspect of the infraspinatus.  The greater tuberosity was exposed.  This was documented with digital camera.  After thorough glenohumeral debridement, the scope was placed in a subacromial space followed by extensive bursectomy, leveling the acromion to a type 1 morphology, removal of the capsule AC joint, and resection of the distal centimeter of clavicle with arthroscopic technique.  Hemostasis was achieved by bipolar cautery.  We subsequently examined the cuff tear and found that was easily replaced in anatomic position.  However, due to the complex to the tear involving the rotator interval, I elected to perform the repair with a double row technique through a small muscle-splitting incision.  A 2-cm incision was fashioned in the anterior/middle thirds of the deltoid, followed by bursal incision and bursectomy.  The greater tuberosity was exposed and the morphology of the tear studied.  We subsequently used a power bur to lower the profile of the greater tuberosity 3 mm to bleeding cancellous surface.  The long head of the biceps was stable in its groove.  We subsequently performed a double diamondback repair in the manner of [place] with 2 medial swivel locks with fiber tape.  Corner sutures of #2 FiberWire were employed for inset along joint the margin..  A pair of lateral swivel locks and a fixation of the Fibertape supraspinatus  and infraspinatus junction mattress suture in a lateralized position with a push lock placed distally and inferior to the lateral swivel locks.  This gave an anatomic footprint repair with excellent profile.  I used a hand rasp to contour the lateral acromion to prevent impingement on the sutures.  The construct was anatomic and quite secure.  Subacromial space was then thoroughly lavaged with sterile saline using the arthroscopic pump and cannula.  We then performed a repair of the muscle split with a pair of figure-of-eight sutures of 0 Vicryl followed by repair of the skin with subcutaneous 0 Vicryl, 2-0 Vicryl, and intradermal 3-0 Prolene.  The portals repaired with intradermal 3-0 Prolene.  There were no apparent complications.  Donald Blair tolerated the surgery and anesthesia well.  He was awakened from general anesthesia and transferred to recovery room with stable vital signs.  He will be admitted to recovery care center for observation, vital signs, and prophylactic antibiotics in the form of Ancef 2 g IV q.6 hours and analgesics in the form of p.o. and IV Dilaudid.     Katy Fitch Hortense Cantrall, M.D.     RVS/MEDQ  D:  10/03/2012  T:  10/03/2012  Job:  [id number]  cc:   Lupe Carney, MD

## 2012-11-07 ENCOUNTER — Telehealth: Payer: Self-pay | Admitting: Oncology

## 2012-11-07 NOTE — Telephone Encounter (Signed)
S/W PT IN RE TO NP APPT 08/13 @ 10:30 W/DR. SHADAD.  REFERRING DR. Laverle Patter DX- PROSTATE CA

## 2012-11-08 ENCOUNTER — Ambulatory Visit (HOSPITAL_BASED_OUTPATIENT_CLINIC_OR_DEPARTMENT_OTHER): Payer: Medicare Other | Admitting: Oncology

## 2012-11-08 ENCOUNTER — Telehealth: Payer: Self-pay | Admitting: Oncology

## 2012-11-08 ENCOUNTER — Encounter: Payer: Self-pay | Admitting: Oncology

## 2012-11-08 ENCOUNTER — Ambulatory Visit: Payer: Medicare Other

## 2012-11-08 ENCOUNTER — Other Ambulatory Visit (HOSPITAL_BASED_OUTPATIENT_CLINIC_OR_DEPARTMENT_OTHER): Payer: Medicare Other | Admitting: Lab

## 2012-11-08 VITALS — BP 187/89 | HR 107 | Temp 98.2°F | Resp 18 | Ht 71.0 in | Wt 289.5 lb

## 2012-11-08 DIAGNOSIS — C7951 Secondary malignant neoplasm of bone: Secondary | ICD-10-CM

## 2012-11-08 DIAGNOSIS — R599 Enlarged lymph nodes, unspecified: Secondary | ICD-10-CM

## 2012-11-08 DIAGNOSIS — C61 Malignant neoplasm of prostate: Secondary | ICD-10-CM

## 2012-11-08 MED ORDER — ABIRATERONE ACETATE 250 MG PO TABS: 1000.0000 mg | ORAL_TABLET | Freq: Every day | ORAL | Status: DC

## 2012-11-08 NOTE — Progress Notes (Signed)
Script for zytiga given to managed care for financial assistance. 

## 2012-11-08 NOTE — Progress Notes (Signed)
Reason for Referral: Prostate cancer   HPI: This is a pleasant 72 year old gentleman currently of [place] referred to me for the evaluation of prostate cancer. His initial diagnosis was in 2005 where he presented with a Gleason score 3+4 equals 7 with clinical stage TI c disease. His pretreatment PSA was 7 His prior therapy were as follows: He underwent external beam radiation with seed implants completed in July of 2005. He developed a PSA recurrence in July of 2010 but his prostate biopsy was negative at that time. He began androgen depravation of February 2011 due to rapid doubling PSA. His PSA went to undetectable at that time your.  He began intermittent androgen deprivation in February of 2012 till March of 2013 where he developed a rise in his PSA indicating castration resistant disease.  He was treated as a part of the STRIVT trial May of 2013 after he developed a measurable disease with retroperitoneal lymphadenopathy and bone metastasis. In January of 2014 he developed further progression of his disease and was treated with Provenge immune therapy in April of 2014. Most recently, he developed a rapidly rising PSA or 140 on 10/31/2012 with a castrate levels of testosterone of 32 his PSA previously was 12.2 in April 2013.  The patient was referred to me for the evaluation and management of castration resistant disease with a rapidly progressing PSA. Clinically, he is asymptomatic. He is not reporting any back pain shoulder pain or hip pain. Does not report any weight loss or abdominal pain. Does not report any constitutional symptoms or genitourinary complaints. His continued to have a reasonable performance status he is bothered mostly by his weight but able to perform most activities of daily living without any hindrance or decline.   Past Medical History  Diagnosis Date  . Cancer     prostate  . Hyperlipidemia   . Wears glasses   . Shortness of breath   . Arthritis   :  Past  Surgical History  Procedure Laterality Date  . Tonsillectomy    . Hernia repair  1972    lt ing  . Knee arthroscopy  1999    right  . Knee arthroscopy  2001    left  . Transperineal implant of radiation seeds w/ ultrasound  2005    prostate  . Shoulder arthroscopy with rotator cuff repair Right 10/03/2012    Procedure: RIGHT SHOULDER ARTHROSCOPY WITH OPEN ROTATOR CUFF REPAIR AND DISTAL CLAVICLE EXCISION;  Surgeon: Wyn Forster., MD;  Location: [facility];  Service: Orthopedics;  Laterality: Right;  :   Current Outpatient Prescriptions  Medication Sig Dispense Refill  . ALPRAZolam (XANAX) 0.25 MG tablet Take 1 tablet by mouth 3 (three) times daily.      Marland Kitchen aspirin 81 MG tablet Take 81 mg by mouth daily.      . calcium citrate-vitamin D (CITRACAL+D) 315-200 MG-UNIT per tablet Take 1 tablet by mouth 2 (two) times daily.      . fish oil-omega-3 fatty acids 1000 MG capsule Take 2 g by mouth daily.      . furosemide (LASIX) 20 MG tablet Take 20 mg by mouth daily.      . Multiple Vitamins-Minerals (MULTIVITAMIN WITH MINERALS) tablet Take 1 tablet by mouth daily.      . naproxen sodium (ANAPROX) 220 MG tablet Take 220 mg by mouth 2 (two) times daily with a meal.      . pravastatin (PRAVACHOL) 40 MG tablet Take 40 mg by mouth  daily.      . abiraterone Acetate (ZYTIGA) 250 MG tablet Take 4 tablets (1,000 mg total) by mouth daily. Take on an empty stomach 1 hour before or 2 hours after a meal  120 tablet  0   No current facility-administered medications for this visit.      No Known Allergies:  No family history on file.:  History   Social History  . Marital Status: Married    Spouse Name: N/A    Number of Children: N/A  . Years of Education: N/A   Occupational History  . Not on file.   Social History Main Topics  . Smoking status: Former Games developer  . Smokeless tobacco: Former Neurosurgeon    Quit date: 09/27/1972  . Alcohol Use: No  . Drug Use: No  . Sexual  Activity: Not on file   Other Topics Concern  . Not on file   Social History Narrative  . No narrative on file  :  A comprehensive review of systems was negative.  Exam: ECOG 0 Blood pressure 187/89, pulse 107, temperature 98.2 F (36.8 C), temperature source Oral, resp. rate 18, height 5\' 11"  (1.803 m), weight 289 lb 8 oz (131.316 kg). General appearance: alert, cooperative and appears stated age Head: Normocephalic, without obvious abnormality, atraumatic Nose: Nares normal. Septum midline. Mucosa normal. No drainage or sinus tenderness. Throat: lips, mucosa, and tongue normal; teeth and gums normal Neck: no adenopathy, no carotid bruit, no JVD, supple, symmetrical, trachea midline and thyroid not enlarged, symmetric, no tenderness/mass/nodules Back: symmetric, no curvature. ROM normal. No CVA tenderness. Resp: clear to auscultation bilaterally Cardio: regular rate and rhythm, S1, S2 normal, no murmur, click, rub or gallop GI: soft, non-tender; bowel sounds normal; no masses,  no organomegaly Extremities: extremities normal, atraumatic, no cyanosis or edema Pulses: 2+ and symmetric Skin: Skin color, texture, turgor normal. No rashes or lesions Lymph nodes: Cervical, supraclavicular, and axillary nodes normal. Neurologic: Grossly normal    Assessment and Plan:   72 year old gentleman with the following issues:  1. 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 His initial diagnosis was in 2010 with a Gleason score of 3+4 equals 7 and a PSA of 7. He is status post hormonal deprivation intermittently and then continuously and most recently progressed on Provenge. His most recent PSA was as high as 140.2. His most recent scans were done in January 2014 which show bony metastasis and lymphadenopathy. Treatment options were discussed today in detail with the patient and his wife. I would include different hormonal manipulation in the form  of Zytiga, ketoconazole or Xtandi. Systemic chemotherapy in the form of Taxotere was also discussed and finally bone directed therapy such as a Standley Dakins is also an option. After weighing the risks and benefits of all these options, he decided to proceed with Zytiga. Risks and benefits specifically a Zytiga were discussed and written information was given to the patient. Complications that includes hepatotoxicity, adrenal insufficiency, electrolytes and balance, constitutional symptoms of fatigue and tiredness, and lower extremity edema were discussed today in detail. He is willing to proceed and will get that scheduled to start in the near future.  2. Androgen deprivation he is continue Lupron under the care of Dr. Laverle Patter and we will be happy to do that for him here in the future should he elect stable.  3. Bone directed therapy: He is on Xgeva which I agree with palliative certainly happy to do that for him in the  future if he prefers to do so.  4. Followup: In about a month to assess toxicity from [place].

## 2012-11-08 NOTE — Progress Notes (Signed)
Checked in new pt with no financial concerns. °

## 2012-11-08 NOTE — Telephone Encounter (Signed)
gv and printed appt sched and avs for pt....pt wants water based  °

## 2012-11-09 ENCOUNTER — Encounter: Payer: Self-pay | Admitting: Oncology

## 2012-11-09 NOTE — Progress Notes (Signed)
[facility], [id number], approved zytiga 250mg  from 11/08/12-11/08/13 [id number].

## 2012-11-14 ENCOUNTER — Encounter (HOSPITAL_COMMUNITY)
Admission: RE | Admit: 2012-11-14 | Discharge: 2012-11-14 | Disposition: A | Discharge status: Home / Self Care | Payer: Medicare Other | Source: Ambulatory Visit | Attending: Oncology | Admitting: Oncology

## 2012-11-14 ENCOUNTER — Ambulatory Visit (HOSPITAL_COMMUNITY)
Admission: RE | Admit: 2012-11-14 | Discharge: 2012-11-14 | Disposition: A | Discharge status: Home / Self Care | Payer: Medicare Other | Source: Ambulatory Visit | Attending: Oncology | Admitting: Oncology

## 2012-11-14 DIAGNOSIS — N281 Cyst of kidney, acquired: Secondary | ICD-10-CM | POA: Insufficient documentation

## 2012-11-14 DIAGNOSIS — K8020 Calculus of gallbladder without cholecystitis without obstruction: Secondary | ICD-10-CM | POA: Insufficient documentation

## 2012-11-14 DIAGNOSIS — R918 Other nonspecific abnormal finding of lung field: Secondary | ICD-10-CM | POA: Insufficient documentation

## 2012-11-14 DIAGNOSIS — C61 Malignant neoplasm of prostate: Secondary | ICD-10-CM | POA: Diagnosis present

## 2012-11-14 DIAGNOSIS — R948 Abnormal results of function studies of other organs and systems: Secondary | ICD-10-CM | POA: Diagnosis not present

## 2012-11-14 DIAGNOSIS — C7951 Secondary malignant neoplasm of bone: Secondary | ICD-10-CM | POA: Insufficient documentation

## 2012-11-14 DIAGNOSIS — R599 Enlarged lymph nodes, unspecified: Secondary | ICD-10-CM | POA: Insufficient documentation

## 2012-11-14 DIAGNOSIS — C778 Secondary and unspecified malignant neoplasm of lymph nodes of multiple regions: Secondary | ICD-10-CM | POA: Insufficient documentation

## 2012-11-14 MED ORDER — TECHNETIUM TC 99M MEDRONATE IV KIT
24.0000 | PACK | Freq: Once | INTRAVENOUS | Status: AC | PRN
  Administered 2012-11-14: 24 via INTRAVENOUS

## 2012-11-14 MED ORDER — IOHEXOL 300 MG/ML  SOLN
125.0000 mL | Freq: Once | INTRAMUSCULAR | Status: AC | PRN
  Administered 2012-11-14: 125 mL via INTRAVENOUS

## 2012-11-14 MED ORDER — IOHEXOL 300 MG/ML  SOLN
50.0000 mL | Freq: Once | INTRAMUSCULAR | Status: AC | PRN
  Administered 2012-11-14: 50 mL via ORAL

## 2012-11-30 ENCOUNTER — Telehealth: Payer: Self-pay

## 2012-11-30 NOTE — Telephone Encounter (Signed)
Mrs. Armor called cc of her husband having increased swelling in feet and legs.  He currently takes lasix 20 mg daily.  Began to elevate feet and legs today. Told her that Dr. Clelia Croft  said to continue to elevate feet and legs and continue with current lasix dose.  Wif verbalized understanding.

## 2012-12-01 ENCOUNTER — Telehealth: Payer: Self-pay | Admitting: Oncology

## 2012-12-01 NOTE — Telephone Encounter (Signed)
MOVED 9/16 APPT TO 9/15. S/W PT HE IS AWARE OF NEW D/T AND HAS APPTS FOR BOTH 9/12 AND 9/15.

## 2012-12-08 ENCOUNTER — Ambulatory Visit (HOSPITAL_BASED_OUTPATIENT_CLINIC_OR_DEPARTMENT_OTHER): Payer: Medicare Other | Admitting: Lab

## 2012-12-08 DIAGNOSIS — C61 Malignant neoplasm of prostate: Secondary | ICD-10-CM

## 2012-12-08 LAB — CBC WITH DIFFERENTIAL/PLATELET
Eosinophils Absolute: 0.7 10*3/uL — ABNORMAL HIGH (ref 0.0–0.5)
LYMPH%: 24.1 % (ref 14.0–49.0)
MONO#: 0.5 10*3/uL (ref 0.1–0.9)
NEUT#: 4.6 10*3/uL (ref 1.5–6.5)
Platelets: 304 10*3/uL (ref 140–400)
RBC: 4.57 10*6/uL (ref 4.20–5.82)
WBC: 7.8 10*3/uL (ref 4.0–10.3)
lymph#: 1.9 10*3/uL (ref 0.9–3.3)

## 2012-12-08 LAB — COMPREHENSIVE METABOLIC PANEL (CC13)
ALT: 20 U/L (ref 0–55)
Albumin: 3.2 g/dL — ABNORMAL LOW (ref 3.5–5.0)
CO2: 29 mEq/L (ref 22–29)
Calcium: 8.9 mg/dL (ref 8.4–10.4)
Chloride: 103 mEq/L (ref 98–109)
Glucose: 123 mg/dl (ref 70–140)
Potassium: 3.7 mEq/L (ref 3.5–5.1)
Sodium: 139 mEq/L (ref 136–145)
Total Protein: 6.9 g/dL (ref 6.4–8.3)

## 2012-12-08 LAB — PSA: PSA: 104.4 ng/mL — ABNORMAL HIGH (ref <=4.00)

## 2012-12-11 ENCOUNTER — Telehealth: Payer: Self-pay | Admitting: Oncology

## 2012-12-11 ENCOUNTER — Encounter: Payer: Self-pay | Admitting: Oncology

## 2012-12-11 ENCOUNTER — Other Ambulatory Visit: Payer: Self-pay | Admitting: *Deleted

## 2012-12-11 ENCOUNTER — Ambulatory Visit (HOSPITAL_BASED_OUTPATIENT_CLINIC_OR_DEPARTMENT_OTHER): Payer: Medicare Other | Admitting: Oncology

## 2012-12-11 VITALS — BP 168/84 | HR 108 | Temp 98.2°F | Resp 22 | Ht 71.0 in | Wt 293.7 lb

## 2012-12-11 DIAGNOSIS — C7951 Secondary malignant neoplasm of bone: Secondary | ICD-10-CM | POA: Insufficient documentation

## 2012-12-11 DIAGNOSIS — C61 Malignant neoplasm of prostate: Secondary | ICD-10-CM

## 2012-12-11 MED ORDER — ONDANSETRON HCL 8 MG PO TABS: 8.0000 mg | ORAL_TABLET | Freq: Two times a day (BID) | ORAL | Status: AC | PRN

## 2012-12-11 MED ORDER — POTASSIUM CHLORIDE CRYS ER 20 MEQ PO TBCR: 20.0000 meq | EXTENDED_RELEASE_TABLET | Freq: Every day | ORAL | Status: AC

## 2012-12-11 MED ORDER — ABIRATERONE ACETATE 250 MG PO TABS: 1000.0000 mg | ORAL_TABLET | Freq: Every day | ORAL | Status: DC

## 2012-12-11 MED ORDER — FUROSEMIDE 20 MG PO TABS: 20.0000 mg | ORAL_TABLET | Freq: Two times a day (BID) | ORAL | Status: DC

## 2012-12-11 NOTE — Telephone Encounter (Signed)
gv pt appt schedule for October.  °

## 2012-12-11 NOTE — Progress Notes (Signed)
Hematology and Oncology Follow Up Visit  Donald Blair [id number] 1941-02-12 72 y.o. 12/11/2012 4:23 PM Donald Blair, MDMitchell, Donald Settler, MD   Principle Diagnosis: This is a 72 year old gentleman with castration resistant prostate cancer. He has metastatic disease to the bone and lymph nodes. His initial diagnosis was in 2005 with a Gleason score 3+4 equals 7 and a PSA of 7.  Prior Therapy:  1. He underwent external beam radiation with seed implants completed in July 2005. 2. He developed PSA recurrence in July 2010 but his prostate biopsy was negative at that time. 3. He started androgen deprivation February 2011 due to a rapid doubling of his PSA. 4. He received intermittent androgen deprivation in February 2012 until March 2013 her he developed a rise in his PSA indicating castration resistant disease. 5. He was treated as part of the STRIVT trial in May of 2013 after developing measurable disease with retroperitoneal lymphadenopathy and bone metastasis. 6. In January 2014 he developed further progression of his disease and was treated with Provenge immune therapy in April 2014.   Current therapy:  1. Zytiga 1000 mg daily started on 11/14/2012 2. He receives Lupron at [facility] 3. He receives [place] monthly at [facility]  Interim History:  Donald Blair is seen for routine followup today with his wife. He is a nice gentleman with the above diagnosis. The patient recently started on Zytiga. He states that he has nausea intermittently. He has vomited on several occasions. Most concerning to him is increased lower extremity edema. He is already receiving Lasix 20 mg daily by his primary care provider. He has been trying to elevate his legs as much as possible. He denies chest pain, shortness of breath at rest, abdominal pain. He has dyspnea on exertion which is unchanged from his baseline. No hematuria or other bleeding noted. Denies back, hip, and shoulder pain. Appetite  is good and weight is stable.  Medications: I have reviewed the patient's current medications.  Current Outpatient Prescriptions  Medication Sig Dispense Refill  . abiraterone Acetate (ZYTIGA) 250 MG tablet Take 4 tablets (1,000 mg total) by mouth daily. Take on an empty stomach 1 hour before or 2 hours after a meal  120 tablet  0  . ALPRAZolam (XANAX) 0.25 MG tablet Take 1 tablet by mouth 3 (three) times daily as needed.       Marland Kitchen aspirin 81 MG tablet Take 81 mg by mouth daily.      . calcium citrate-vitamin D (CITRACAL+D) 315-200 MG-UNIT per tablet Take 1 tablet by mouth 2 (two) times daily.      . fish oil-omega-3 fatty acids 1000 MG capsule Take 2 g by mouth daily.      . furosemide (LASIX) 20 MG tablet Take 1 tablet (20 mg total) by mouth 2 (two) times daily.  60 tablet  1  . Multiple Vitamins-Minerals (MULTIVITAMIN WITH MINERALS) tablet Take 1 tablet by mouth daily.      . naproxen sodium (ANAPROX) 220 MG tablet Take 220 mg by mouth 2 (two) times daily with a meal.      . ondansetron (ZOFRAN) 8 MG tablet Take 1 tablet (8 mg total) by mouth every 12 (twelve) hours as needed for nausea.  30 tablet  1  . potassium chloride SA (K-DUR,KLOR-CON) 20 MEQ tablet Take 1 tablet (20 mEq total) by mouth daily.  30 tablet  1  . pravastatin (PRAVACHOL) 40 MG tablet Take 40 mg by mouth daily.  No current facility-administered medications for this visit.     Allergies: No Known Allergies  Past Medical History, Surgical history, Social history, and Family History were reviewed and updated.  Review of Systems: Constitutional:  Negative for fever, chills, night sweats, anorexia, weight loss, pain. Cardiovascular: no chest pain or dyspnea on exertion Respiratory: no cough, shortness of breath, or wheezing Neurological: no TIA or stroke symptoms Dermatological: negative ENT: negative Skin: Negative. Gastrointestinal: no abdominal pain, change in bowel habits, or black or bloody  stools Genito-Urinary: no dysuria, trouble voiding, or hematuria Hematological and Lymphatic: negative Breast: negative for breast lumps Musculoskeletal: negative Remaining ROS negative.  Physical Exam: Blood pressure 168/84, pulse 108, temperature 98.2 F (36.8 C), temperature source Oral, resp. rate 22, height 5\' 11"  (1.803 m), weight 293 lb 11.2 oz (133.221 kg), SpO2 96.00%. ECOG: 1 General appearance: alert, cooperative and no distress Head: Normocephalic, without obvious abnormality, atraumatic Neck: no adenopathy, no carotid bruit, no JVD, supple, symmetrical, trachea midline and thyroid not enlarged, symmetric, no tenderness/mass/nodules Lymph nodes: Cervical adenopathy: negative, Axillary adenopathy: negative and Supraclavicular adenopathy: Positive left supraclavicular node measuring approximately 2 cm Heart:regular rate and rhythm, S1, S2 normal, no murmur, click, rub or gallop Lung:chest clear, no wheezing, rales, normal symmetric air entry, no tachypnea, retractions or cyanosis Abdomen: soft, non-tender, without masses or organomegaly EXT:no erythema, induration, or nodules. He has 1+ pitting edema bilaterally.   Lab Results: Lab Results  Component Value Date   WBC 7.8 12/08/2012   HGB 13.0 12/08/2012   HCT 38.6 12/08/2012   MCV 84.5 12/08/2012   PLT 304 12/08/2012     Chemistry      Component Value Date/Time   NA 139 12/08/2012 0841   NA 139 10/22/2007 2314   K 3.7 12/08/2012 0841   K 4.0 10/22/2007 2314   CL 103 10/22/2007 2314   CO2 29 12/08/2012 0841   CO2 28 10/22/2007 2256   BUN 14.0 12/08/2012 0841   BUN 17 10/22/2007 2314   CREATININE 0.9 12/08/2012 0841   CREATININE 1.1 10/22/2007 2314      Component Value Date/Time   CALCIUM 8.9 12/08/2012 0841   CALCIUM 10.0 10/22/2007 2256   ALKPHOS 289* 12/08/2012 0841   ALKPHOS 58 10/22/2007 2256   AST 26 12/08/2012 0841   AST 21 10/22/2007 2256   ALT 20 12/08/2012 0841   ALT 26 10/22/2007 2256   BILITOT 0.83 12/08/2012 0841    BILITOT 1.1 10/22/2007 2256     Results for Donald Blair (MRN [id number]) as of 12/11/2012 09:16  Ref. Range 12/08/2012 08:41  PSA Latest Range: <=4.00 ng/mL 104.40 (H)   Imaging:  *RADIOLOGY REPORT*  Clinical Data: Prostate cancer diagnosed 2005. Rising PSA  CT CHEST, ABDOMEN AND PELVIS WITH CONTRAST  Technique: Multidetector CT imaging of the chest, abdomen and  pelvis was performed following the standard protocol during bolus  administration of intravenous contrast.  Contrast: 50mL OMNIPAQUE IOHEXOL 300 MG/ML SOLN, OMNIPAQUE  IOHEXOL 300 MG/ML SOLN  Comparison: CT abdomen pelvis 04/12/2012, bone scan 04/12/2012  CT CHEST  Findings: Large left supraclavicular node measuring 4.1 x 4.9 cm  which extends into the left upper mediastinum. There is enlarged  right lower paratracheal lymph node measuring 20 mm short axis. The  prevascular metastatic node measures 12 mm. Subcarinal lymph node  measures 11 mm.  Review of the lung parenchyma demonstrates an 8 mm nodule in the  inferior right middle lobe unchanged. Small 2 mm right upper lobe  nodule (image 33) is also unchanged. There is atelectasis at the  left lung base which is new.  IMPRESSION:  1. Bulky left supraclavicular metastatic adenopathy.  2. Metastatic adenopathy in the mediastinum.  3. Stable small pulmonary nodules.  CT ABDOMEN AND PELVIS  Findings: The tiny subcapsular hypodensity in the right hepatic  lobe measures 8 mm (image 59). Gallstones in the gallbladder. The  pancreas, spleen, adrenal glands, kidneys are unchanged.  Nonenhancing cyst in the posterior aspect of the right kidney.  The stomach, small bowel, and colon unremarkable.  Abdominal aorta is normal caliber.  There is bulky retroperitoneal periaortic lymphadenopathy which is  increased significantly compared to prior. For example aortocaval  lymph node measures the 2.5 cm (image 74) increased from 10 mm on  prior. Lymph node left of the aorta  at the level the bifurcation  measures 3.0 cm compared to mild to 2.2 cm on prior.  Right external iliac lymph node measuring 2.8 cm increased from 1.0  cm. No significant inguinal lymphadenopathy. Left common iliac  lymph node measures 2.5 cm (image 97) compared to 1.3 cm on prior.  No acute varices the prostate bed. Bladder appears normal.  Review bone windows demonstrates increased sclerosis in the  posterior aspect of the left ilium (image 105). Interval increase  in sclerosis of the T12 vertebral body. There is sclerotic lesion  at T8 vertebral body which is new. There is a mid sternal lesion  which compares to prior the bone scan.  IMPRESSION:  1. Marked progression of periaortic retroperitoneal and iliac  lymphadenopathy.  2. Progression of skeletal metastasis with a new lesion at T8 and  increased sclerosis at T12. Concern for new lesion in the left  ilium.  3. Small hypodensity within the subcapsular right hepatic lobe.  Recommend attention on follow-up.  Original Report Authenticated By: Genevive Bi, M.D.  *RADIOLOGY REPORT*  Clinical Data: Prostate carcinoma  NUCLEAR MEDICINE WHOLE BODY BONE SCINTIGRAPHY  Technique: Whole body anterior and posterior images were obtained  approximately 3 hours after intravenous injection of  radiopharmaceutical.  Views: Whole body  Radiopharmaceutical: Technetium 35m methylene diphosphonate  Dose: 24.0 mCi  Route of administration: Intravenous  Comparison: April 12, 2012  Findings: There has been a dramatic change compared the prior  study. There are foci of increased uptake at T5, T6, T9, and L1 in  the spine. There is abnormal uptake in the left sacroiliac joint.  There is a new focus of increased uptake in the lateral right iliac  crest. There is abnormal uptake in both ninth ribs posteriorly as  well as in the posterior left eighth rib. There are several foci  of increased uptake in several upper ribs as well, most notably in   the anterior right second rib and anterior right fourth rib.  There is extensive increased uptake throughout much of the left  femur as well as more subtly in the intertrochanteric femur region  on the left.  There is abnormal uptake in the proximal left humerus. There is  stable abnormal uptake in the distal left clavicle. There is  arthropathy in the knees and ankles remain.  Kidneys are appreciable bilaterally.  IMPRESSION:  Multiple new areas of abnormal radiotracer uptake consistent with  progression of bony metastatic disease.  Original Report Authenticated By: Bretta Bang, M.D.   Impression and Plan: This is a 72 year old gentleman with the following issues: 1. Castration resistant prostate cancer. He is currently on Zytiga. PSA has dropped from 140 down  to 104 after approximately 3 weeks of therapy. He is having grade 1 nausea and grade 2 lower extremity edema. Recommend that he continue Zytiga for at least one more month without dose modification. 2. Androgen deprivation. He will continue on Lupron under the care of Dr. Laverle Patter. 3. Bone directed therapy. He will continue on Xgeva under the care of Dr. Laverle Patter. 4. Nausea. Likely related to his Zytiga. I have given him a prescription for Zofran. 5. Lower extremity edema. I have increased his Lasix to 20 mg twice a day. I have also given him a prescription for potassium supplementation. He was encouraged to elevate his legs as much as possible. 6. Followup. He will return in approximately 4-5 weeks.  Spent more than half the time coordinating care.    [place], [place] 9/15/20144:23 PM

## 2012-12-12 ENCOUNTER — Ambulatory Visit: Payer: Self-pay | Admitting: Oncology

## 2012-12-12 NOTE — Telephone Encounter (Signed)
Refilled zytiga  

## 2013-01-10 ENCOUNTER — Ambulatory Visit (HOSPITAL_BASED_OUTPATIENT_CLINIC_OR_DEPARTMENT_OTHER): Payer: Medicare Other | Admitting: Oncology

## 2013-01-10 ENCOUNTER — Other Ambulatory Visit (HOSPITAL_BASED_OUTPATIENT_CLINIC_OR_DEPARTMENT_OTHER): Payer: Medicare Other | Admitting: Lab

## 2013-01-10 ENCOUNTER — Telehealth: Payer: Self-pay | Admitting: Oncology

## 2013-01-10 VITALS — BP 152/89 | HR 112 | Temp 97.8°F | Resp 20 | Ht 71.0 in | Wt 292.8 lb

## 2013-01-10 DIAGNOSIS — C61 Malignant neoplasm of prostate: Secondary | ICD-10-CM

## 2013-01-10 DIAGNOSIS — C779 Secondary and unspecified malignant neoplasm of lymph node, unspecified: Secondary | ICD-10-CM

## 2013-01-10 DIAGNOSIS — C7951 Secondary malignant neoplasm of bone: Secondary | ICD-10-CM

## 2013-01-10 LAB — COMPREHENSIVE METABOLIC PANEL (CC13)
ALT: 109 U/L — ABNORMAL HIGH (ref 0–55)
AST: 71 U/L — ABNORMAL HIGH (ref 5–34)
Anion Gap: 8 mEq/L (ref 3–11)
CO2: 28 mEq/L (ref 22–29)
Calcium: 9 mg/dL (ref 8.4–10.4)
Chloride: 104 mEq/L (ref 98–109)
Creatinine: 0.9 mg/dL (ref 0.7–1.3)
Sodium: 139 mEq/L (ref 136–145)
Total Bilirubin: 1.01 mg/dL (ref 0.20–1.20)
Total Protein: 7.1 g/dL (ref 6.4–8.3)

## 2013-01-10 LAB — CBC WITH DIFFERENTIAL/PLATELET
BASO%: 1.6 % (ref 0.0–2.0)
EOS%: 12.3 % — ABNORMAL HIGH (ref 0.0–7.0)
MCH: 28.6 pg (ref 27.2–33.4)
MCHC: 33.5 g/dL (ref 32.0–36.0)
MONO#: 0.5 10*3/uL (ref 0.1–0.9)
RBC: 4.74 10*6/uL (ref 4.20–5.82)
WBC: 9 10*3/uL (ref 4.0–10.3)
lymph#: 2 10*3/uL (ref 0.9–3.3)

## 2013-01-10 NOTE — Telephone Encounter (Signed)
gv and printed appt sched and avs for pt for NOV °

## 2013-01-10 NOTE — Progress Notes (Signed)
Hematology and Oncology Follow Up Visit  KNOWLEDGE ESCANDON [id number] 1940-12-02 72 y.o. 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 His initial diagnosis was in 2005 with a Gleason score 3+4 equals 7 and a PSA of 7.  Prior Therapy:  1. He underwent external beam radiation with seed implants completed in July 2005. 2. He developed PSA recurrence in July 2010 but his prostate biopsy was negative at that time. 3. He started androgen deprivation February 2011 due to a rapid doubling of his PSA. 4. He received intermittent androgen deprivation in February 2012 until March 2013 her he developed a rise in his PSA indicating castration resistant disease. 5. He was treated as part of the STRIVT trial in May of 2013 after developing measurable disease with retroperitoneal lymphadenopathy and bone metastasis. 6. In January 2014 he developed further progression of his disease and was treated with Provenge immune therapy in April 2014.   Current therapy:  1. Donald Blair 1000 mg daily started on 11/14/2012 2. He receives Donald Blair at [facility] urology 3. He receives [place] monthly at [facility] urology  Interim History:  Donald Blair is seen for routine followup today with his wife. He is a nice gentleman with the above diagnosis. He has been on Donald Blair without porblems. He states that he has nausea intermittently. He has vomited on several occasions. Most concerning to him is increased lower extremity edema which has improved since last visit. He is already receiving Donald Blair 20 mg daily by his primary care provider. He has been trying to elevate his legs as much as possible. He denies chest pain, shortness of breath at rest, abdominal pain. He has dyspnea on exertion which is unchanged from his baseline. No hematuria or other bleeding noted. Denies back, hip,  and shoulder pain. Appetite is good and weight is stable.  Medications: I have reviewed the patient's current medications.  Current Outpatient Prescriptions  Medication Sig Dispense Refill  . abiraterone Acetate (Donald Blair) 250 MG tablet Take 4 tablets (1,000 mg total) by mouth daily. Take on an empty stomach 1 hour before or 2 hours after a meal  120 tablet  0  . ALPRAZolam (XANAX) 0.25 MG tablet Take 1 tablet by mouth 3 (three) times daily as needed.       Marland Kitchen aspirin 81 MG tablet Take 81 mg by mouth daily.      . calcium citrate-vitamin D (CITRACAL+D) 315-200 MG-UNIT per tablet Take 1 tablet by mouth 2 (two) times daily.      . fish oil-omega-3 fatty acids 1000 MG capsule Take 2 g by mouth daily.      . furosemide (Donald Blair) 20 MG tablet Take 1 tablet (20 mg total) by mouth 2 (two) times daily.  60 tablet  1  . Multiple Vitamins-Minerals (MULTIVITAMIN WITH MINERALS) tablet Take 1 tablet by mouth daily.      . naproxen sodium (ANAPROX) 220 MG tablet Take 220 mg by mouth 2 (two) times daily with a meal.      . ondansetron (ZOFRAN) 8 MG tablet Take 1 tablet (8 mg total) by mouth every 12 (twelve) hours as needed for nausea.  30 tablet  1  . potassium chloride SA (K-DUR,KLOR-CON) 20 MEQ tablet Take 1 tablet (20 mEq total) by mouth daily.  30 tablet  1  . pravastatin (PRAVACHOL) 40 MG tablet Take 40 mg by mouth daily.  No current facility-administered medications for this visit.     Allergies: No Known Allergies  Past Medical History, Surgical history, Social history, and Family History were reviewed and updated.  Review of Systems:  Remaining ROS negative.  Physical Exam: Blood pressure 152/89, pulse 112, temperature 97.8 F (36.6 C), temperature source Oral, resp. rate 20, height 5\' 11"  (1.803 m), weight 292 lb 12.8 oz (132.813 kg). ECOG: 1 General appearance: alert, cooperative and no distress Head: Normocephalic, without obvious abnormality, atraumatic Neck: no adenopathy, no  carotid bruit, no JVD, supple, symmetrical, trachea midline and thyroid not enlarged, symmetric, no tenderness/mass/nodules Lymph nodes: Cervical adenopathy: negative, Axillary adenopathy: negative and Supraclavicular adenopathy: Positive left supraclavicular node measuring approximately 2 cm Heart:regular rate and rhythm, S1, S2 normal, no murmur, click, rub or gallop Lung:chest clear, no wheezing, rales, normal symmetric air entry, no tachypnea, retractions or cyanosis Abdomen: soft, non-tender, without masses or organomegaly EXT:no erythema, induration, or nodules. He has 1+ pitting edema bilaterally.   Lab Results: Lab Results  Component Value Date   WBC 9.0 01/10/2013   HGB 13.5 01/10/2013   HCT 40.4 01/10/2013   MCV 85.2 01/10/2013   PLT 248 01/10/2013     Chemistry      Component Value Date/Time   NA 139 12/08/2012 0841   NA 139 10/22/2007 2314   K 3.7 12/08/2012 0841   K 4.0 10/22/2007 2314   CL 103 10/22/2007 2314   CO2 29 12/08/2012 0841   CO2 28 10/22/2007 2256   BUN 14.0 12/08/2012 0841   BUN 17 10/22/2007 2314   CREATININE 0.9 12/08/2012 0841   CREATININE 1.1 10/22/2007 2314      Component Value Date/Time   CALCIUM 8.9 12/08/2012 0841   CALCIUM 10.0 10/22/2007 2256   ALKPHOS 289* 12/08/2012 0841   ALKPHOS 58 10/22/2007 2256   AST 26 12/08/2012 0841   AST 21 10/22/2007 2256   ALT 20 12/08/2012 0841   ALT 26 10/22/2007 2256   BILITOT 0.83 12/08/2012 0841   BILITOT 1.1 10/22/2007 2256     Results for Donald Blair (MRN [id number]) as of 12/11/2012 09:16  Ref. Range 12/08/2012 08:41  PSA Latest Range: <=4.00 ng/mL 104.40 (H)   Lysle Dingwall, M.D.   Impression and Plan: This is a 72 year old gentleman with the following issues: 1. Castration resistant prostate cancer. He is currently on Donald Blair. PSA has dropped from 140 down to 104 after approximately 3 weeks of therapy. He is having grade 1 nausea and grade 2 lower extremity edema. Recommend that he continue Donald Blair for at  least one more month without dose modification. 2. Androgen deprivation. He will continue on Donald Blair under the care of Dr. Laverle Blair. 3. Bone directed therapy. He will continue on Xgeva under the care of Dr. Laverle Blair. 4. Nausea. Likely related to his Donald Blair. I have given him a prescription for Zofran. 5. Lower extremity edema. Continue Donald Blair 20 mg twice a day. He was encouraged to elevate his legs as much as possible. 6. Followup. He will return in approximately 4-5 weeks.     Donald Blair 10/15/20149:39 AM

## 2013-01-12 ENCOUNTER — Other Ambulatory Visit: Payer: Self-pay | Admitting: Oncology

## 2013-01-12 NOTE — Progress Notes (Signed)
Spoke to wife, gave results of last PSA and re-ordered zytiga

## 2013-02-01 ENCOUNTER — Other Ambulatory Visit: Payer: Self-pay

## 2013-02-09 ENCOUNTER — Other Ambulatory Visit: Payer: Self-pay | Admitting: Oncology

## 2013-02-09 ENCOUNTER — Encounter (HOSPITAL_COMMUNITY): Payer: Self-pay | Admitting: Emergency Medicine

## 2013-02-09 ENCOUNTER — Emergency Department (HOSPITAL_COMMUNITY)
Admission: EM | Admit: 2013-02-09 | Discharge: 2013-02-09 | Disposition: A | Discharge status: Home / Self Care | Payer: Medicare Other | Attending: Emergency Medicine | Admitting: Emergency Medicine

## 2013-02-09 ENCOUNTER — Emergency Department (HOSPITAL_COMMUNITY): Payer: Medicare Other

## 2013-02-09 DIAGNOSIS — M129 Arthropathy, unspecified: Secondary | ICD-10-CM | POA: Insufficient documentation

## 2013-02-09 DIAGNOSIS — R0609 Other forms of dyspnea: Secondary | ICD-10-CM | POA: Insufficient documentation

## 2013-02-09 DIAGNOSIS — R000 Tachycardia, unspecified: Secondary | ICD-10-CM | POA: Insufficient documentation

## 2013-02-09 DIAGNOSIS — R0989 Other specified symptoms and signs involving the circulatory and respiratory systems: Secondary | ICD-10-CM | POA: Insufficient documentation

## 2013-02-09 DIAGNOSIS — M79609 Pain in unspecified limb: Secondary | ICD-10-CM | POA: Insufficient documentation

## 2013-02-09 DIAGNOSIS — R079 Chest pain, unspecified: Secondary | ICD-10-CM | POA: Insufficient documentation

## 2013-02-09 DIAGNOSIS — C7951 Secondary malignant neoplasm of bone: Secondary | ICD-10-CM

## 2013-02-09 DIAGNOSIS — R609 Edema, unspecified: Secondary | ICD-10-CM | POA: Insufficient documentation

## 2013-02-09 DIAGNOSIS — E785 Hyperlipidemia, unspecified: Secondary | ICD-10-CM | POA: Insufficient documentation

## 2013-02-09 DIAGNOSIS — R61 Generalized hyperhidrosis: Secondary | ICD-10-CM | POA: Insufficient documentation

## 2013-02-09 DIAGNOSIS — R911 Solitary pulmonary nodule: Secondary | ICD-10-CM | POA: Insufficient documentation

## 2013-02-09 DIAGNOSIS — Z7982 Long term (current) use of aspirin: Secondary | ICD-10-CM | POA: Insufficient documentation

## 2013-02-09 DIAGNOSIS — Z8709 Personal history of other diseases of the respiratory system: Secondary | ICD-10-CM | POA: Insufficient documentation

## 2013-02-09 DIAGNOSIS — I4510 Unspecified right bundle-branch block: Secondary | ICD-10-CM | POA: Insufficient documentation

## 2013-02-09 DIAGNOSIS — R0682 Tachypnea, not elsewhere classified: Secondary | ICD-10-CM | POA: Insufficient documentation

## 2013-02-09 DIAGNOSIS — R5381 Other malaise: Secondary | ICD-10-CM | POA: Insufficient documentation

## 2013-02-09 DIAGNOSIS — Z789 Other specified health status: Secondary | ICD-10-CM | POA: Insufficient documentation

## 2013-02-09 DIAGNOSIS — C61 Malignant neoplasm of prostate: Secondary | ICD-10-CM | POA: Insufficient documentation

## 2013-02-09 DIAGNOSIS — Z79899 Other long term (current) drug therapy: Secondary | ICD-10-CM | POA: Insufficient documentation

## 2013-02-09 DIAGNOSIS — Z87891 Personal history of nicotine dependence: Secondary | ICD-10-CM | POA: Insufficient documentation

## 2013-02-09 HISTORY — DX: Secondary malignant neoplasm of bone: C79.51

## 2013-02-09 LAB — CBC WITH DIFFERENTIAL/PLATELET
Basophils Relative: 1 % (ref 0–1)
Eosinophils Absolute: 0.7 10*3/uL (ref 0.0–0.7)
Eosinophils Relative: 7 % — ABNORMAL HIGH (ref 0–5)
Hemoglobin: 13.8 g/dL (ref 13.0–17.0)
Lymphs Abs: 2.1 10*3/uL (ref 0.7–4.0)
MCH: 29.1 pg (ref 26.0–34.0)
MCHC: 33.5 g/dL (ref 30.0–36.0)
MCV: 86.9 fL (ref 78.0–100.0)
Monocytes Relative: 8 % (ref 3–12)
Neutrophils Relative %: 64 % (ref 43–77)
RBC: 4.74 MIL/uL (ref 4.22–5.81)

## 2013-02-09 LAB — POCT I-STAT TROPONIN I: Troponin i, poc: 0 ng/mL (ref 0.00–0.08)

## 2013-02-09 LAB — COMPREHENSIVE METABOLIC PANEL
Albumin: 3.4 g/dL — ABNORMAL LOW (ref 3.5–5.2)
BUN: 14 mg/dL (ref 6–23)
Calcium: 8.6 mg/dL (ref 8.4–10.5)
Creatinine, Ser: 1.04 mg/dL (ref 0.50–1.35)
GFR calc Af Amer: 80 mL/min — ABNORMAL LOW (ref >90)
Glucose, Bld: 122 mg/dL — ABNORMAL HIGH (ref 70–99)
Total Protein: 7.1 g/dL (ref 6.0–8.3)

## 2013-02-09 MED ORDER — IOHEXOL 350 MG/ML SOLN: 100.0000 mL | Freq: Once | INTRAVENOUS | Status: DC | PRN

## 2013-02-09 MED ORDER — SODIUM CHLORIDE 0.9 % IV SOLN
INTRAVENOUS | Status: DC
  Administered 2013-02-09: 19:00:00 via INTRAVENOUS

## 2013-02-09 NOTE — ED Notes (Signed)
CT notified that IV line established.  

## 2013-02-09 NOTE — ED Provider Notes (Addendum)
CSN: [id number]     Arrival date & time 02/09/13  1455 History   First MD Initiated Contact with Patient 02/09/13 1557     Chief Complaint  Patient presents with  . Shoulder Pain  . Arm Pain   (Consider location/radiation/quality/duration/timing/severity/associated sxs/prior Treatment) HPI Patient presents with left shoulder pain, resolved prior to my evaluation her Patient states that in the hour prior to arrival the patient was at rest, when he felt to be sudden onset of left shoulder pain, or sore, radiating down the left arm.  Symptoms persisted for approximately 30 minutes, resolved spontaneously.  Patient has baseline dyspnea, baseline fatigue, and these were unchanged. Patient states he is compliant with all medications. Patient has a notable history of metastatic prostate cancer, with a known lung or anterior chest wall nodule. Patient takes oral chemotherapeutic agents, has been inconsistent with these medications.  Past Medical History  Diagnosis Date  . Cancer     prostate  . Hyperlipidemia   . Wears glasses   . Shortness of breath   . Arthritis    Past Surgical History  Procedure Laterality Date  . Tonsillectomy    . Hernia repair  1972    lt ing  . Knee arthroscopy  1999    right  . Knee arthroscopy  2001    left  . Transperineal implant of radiation seeds w/ ultrasound  2005    prostate  . Shoulder arthroscopy with rotator cuff repair Right 10/03/2012    Procedure: RIGHT SHOULDER ARTHROSCOPY WITH OPEN ROTATOR CUFF REPAIR AND DISTAL CLAVICLE EXCISION;  Surgeon: Wyn Forster., MD;  Location: [facility];  Service: Orthopedics;  Laterality: Right;   History reviewed. No pertinent family history. History  Substance Use Topics  . Smoking status: Former Games developer  . Smokeless tobacco: Former Neurosurgeon    Quit date: 09/27/1972  . Alcohol Use: No    Review of Systems  Constitutional:       Per HPI, otherwise negative  HENT:       Per HPI, otherwise  negative  Respiratory:       Per HPI, otherwise negative  Cardiovascular:       Per HPI, otherwise negative  Gastrointestinal: Negative for vomiting.  Endocrine:       Negative aside from HPI  Genitourinary:       Neg aside from HPI   Musculoskeletal:       Per HPI, otherwise negative  Skin: Negative.   Neurological: Negative for syncope.    Allergies  Review of patient's allergies indicates no known allergies.  Home Medications   Current Outpatient Rx  Name  Route  Sig  Dispense  Refill  . aspirin 81 MG tablet   Oral   Take 81 mg by mouth daily.         . calcium citrate-vitamin D (CITRACAL+D) 315-200 MG-UNIT per tablet   Oral   Take 1 tablet by mouth 2 (two) times daily.         . fish oil-omega-3 fatty acids 1000 MG capsule   Oral   Take 2 g by mouth daily.         . furosemide (LASIX) 20 MG tablet   Oral   Take 1 tablet (20 mg total) by mouth 2 (two) times daily.   60 tablet   1   . Multiple Vitamins-Minerals (MULTIVITAMIN WITH MINERALS) tablet   Oral   Take 1 tablet by mouth daily.         Marland Kitchen  naproxen sodium (ANAPROX) 220 MG tablet   Oral   Take 220 mg by mouth 2 (two) times daily with a meal.         . ondansetron (ZOFRAN) 8 MG tablet   Oral   Take 1 tablet (8 mg total) by mouth every 12 (twelve) hours as needed for nausea.   30 tablet   1   . potassium chloride SA (K-DUR,KLOR-CON) 20 MEQ tablet   Oral   Take 1 tablet (20 mEq total) by mouth daily.   30 tablet   1   . pravastatin (PRAVACHOL) 40 MG tablet   Oral   Take 40 mg by mouth daily.         Marland Kitchen ZYTIGA 250 MG tablet      TAKE 4 TABLETS BY MOUTH ONCE DAILY. TAKE ON EMPTY STOMACH 1 HOUR BEFORE OR 2 HOURS AFTER A MEAL.   120 tablet   0    BP 119/92  Pulse 112  Temp(Src) 97.9 F (36.6 C) (Oral)  Resp 24  Ht 5\' 11"  (1.803 m)  Wt 296 lb 1.6 oz (134.31 kg)  BMI 41.32 kg/m2  SpO2 96% Physical Exam  Nursing note and vitals reviewed. Constitutional: He is oriented to  person, place, and time. He appears well-developed.  Large male sitting upright in bed  HENT:  Head: Normocephalic and atraumatic.  Eyes: Conjunctivae and EOM are normal.  Cardiovascular: Regular rhythm.  Tachycardia present.   Pulmonary/Chest: No stridor. Tachypnea noted. No respiratory distress. He has decreased breath sounds.    Abdominal: He exhibits no distension.  Musculoskeletal: He exhibits edema. He exhibits no tenderness.  Neurological: He is alert and oriented to person, place, and time. No cranial nerve deficit. He exhibits normal muscle tone. Coordination normal.  Skin: Skin is warm. He is diaphoretic.  Psychiatric: He has a normal mood and affect. His behavior is normal.    ED Course  Procedures (including critical care time) Labs Review Labs Reviewed  CBC WITH DIFFERENTIAL - Abnormal; Notable for the following:    RDW 15.9 (*)    Eosinophils Relative 7 (*)    All other components within normal limits  COMPREHENSIVE METABOLIC PANEL - Abnormal; Notable for the following:    Glucose, Bld 122 (*)    Albumin 3.4 (*)    AST 38 (*)    Alkaline Phosphatase 218 (*)    GFR calc non Af Amer 69 (*)    GFR calc Af Amer 80 (*)    All other components within normal limits  POCT I-STAT TROPONIN I   Imaging Review No results found.  EKG Interpretation     Ventricular Rate:  105 PR Interval:  150 QRS Duration: 98 QT Interval:  340 QTC Calculation: 449 R Axis:   37 Text Interpretation:  Sinus tachycardia with occasional Premature ventricular complexes Incomplete right bundle branch block No significant change since last tracing Borderline ECG           cardiac 105, tachycardic, abnormal Pulse oximetry 95% room air, borderline  With Hx of active CA, tachycardia and tachypnea, PE is a certain consideration  7:30 PM I reviewed the CT images with the patient and his.  They are abnormal for multiple areas of sclerotic ossification.  He has no complaints, no ongoing  pain.  We discussed follow up instructions, return precautions were MDM   1. Chest pain    Patient presents after an episode of pain that has resolved prior to ED arrival.  With  the patient's history of cancer, this is a likely etiology, though with his tachycardia, tachypnea, PE was in a consideration. Patient's evaluation was largely reassuring, but no evidence of PE on CT scan.  There was demonstration of multiple areas of sclerotic change consistent with progression of disease.  Patient was discharged in stable condition with primary care, oncology followup   Gerhard Munch, MD 02/09/13 1931  Gerhard Munch, MD 02/09/13 [phone]

## 2013-02-09 NOTE — ED Notes (Signed)
Pt reports one episode of Left shoulder pain radiating down his the back side of his Left arm approx 1 hour ago, [place] the fire station and his BP was 190/100. Pt denies chest pain, back/abd pain, N/V, diaphoresis, weakness, or lightheaded. Pt states he has a hx of SOB and does not feel any more SOB than normal.

## 2013-02-13 ENCOUNTER — Encounter: Payer: Self-pay | Admitting: Oncology

## 2013-02-13 ENCOUNTER — Ambulatory Visit (HOSPITAL_BASED_OUTPATIENT_CLINIC_OR_DEPARTMENT_OTHER): Payer: Medicare Other | Admitting: Oncology

## 2013-02-13 ENCOUNTER — Telehealth: Payer: Self-pay | Admitting: Oncology

## 2013-02-13 ENCOUNTER — Other Ambulatory Visit (HOSPITAL_BASED_OUTPATIENT_CLINIC_OR_DEPARTMENT_OTHER): Payer: Medicare Other | Admitting: Lab

## 2013-02-13 VITALS — BP 158/91 | HR 119 | Temp 97.3°F | Resp 20 | Ht 71.0 in | Wt 297.8 lb

## 2013-02-13 DIAGNOSIS — C61 Malignant neoplasm of prostate: Secondary | ICD-10-CM

## 2013-02-13 DIAGNOSIS — R110 Nausea: Secondary | ICD-10-CM

## 2013-02-13 DIAGNOSIS — C7951 Secondary malignant neoplasm of bone: Secondary | ICD-10-CM

## 2013-02-13 DIAGNOSIS — C771 Secondary and unspecified malignant neoplasm of intrathoracic lymph nodes: Secondary | ICD-10-CM

## 2013-02-13 DIAGNOSIS — R609 Edema, unspecified: Secondary | ICD-10-CM

## 2013-02-13 LAB — COMPREHENSIVE METABOLIC PANEL (CC13)
AST: 26 U/L (ref 5–34)
Albumin: 3.3 g/dL — ABNORMAL LOW (ref 3.5–5.0)
Alkaline Phosphatase: 223 U/L — ABNORMAL HIGH (ref 40–150)
Calcium: 9.2 mg/dL (ref 8.4–10.4)
Chloride: 103 mEq/L (ref 98–109)
Glucose: 122 mg/dl (ref 70–140)
Potassium: 3.8 mEq/L (ref 3.5–5.1)
Sodium: 137 mEq/L (ref 136–145)
Total Protein: 7.1 g/dL (ref 6.4–8.3)

## 2013-02-13 LAB — CBC WITH DIFFERENTIAL/PLATELET
Basophils Absolute: 0.1 10*3/uL (ref 0.0–0.1)
Eosinophils Absolute: 0.5 10*3/uL (ref 0.0–0.5)
HGB: 12.8 g/dL — ABNORMAL LOW (ref 13.0–17.1)
MCV: 85.5 fL (ref 79.3–98.0)
MONO%: 7.1 % (ref 0.0–14.0)
NEUT#: 7.6 10*3/uL — ABNORMAL HIGH (ref 1.5–6.5)
RBC: 4.54 10*6/uL (ref 4.20–5.82)
RDW: 16 % — ABNORMAL HIGH (ref 11.0–14.6)
WBC: 10.5 10*3/uL — ABNORMAL HIGH (ref 4.0–10.3)
lymph#: 1.7 10*3/uL (ref 0.9–3.3)

## 2013-02-13 NOTE — Progress Notes (Signed)
Hematology and Oncology Follow Up Visit  Donald Blair [id number] [birth year] 72 y.o. 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 His initial diagnosis was in 2005 with a Gleason score 3+4 equals 7 and a PSA of 7.  Prior Therapy:  1. He underwent external beam radiation with seed implants completed in July 2005. 2. He developed PSA recurrence in July 2010 but his prostate biopsy was negative at that time. 3. He started androgen deprivation February 2011 due to a rapid doubling of his PSA. 4. He received intermittent androgen deprivation in February 2012 until March 2013 her he developed a rise in his PSA indicating castration resistant disease. 5. He was treated as part of the STRIVT trial in May of 2013 after developing measurable disease with retroperitoneal lymphadenopathy and bone metastasis. 6. In January 2014 he developed further progression of his disease and was treated with Provenge immune therapy in April 2014.   Current therapy:  1. Zytiga 1000 mg daily started on 11/14/2012 2. He receives Lupron at [facility] urology 3. He receives [place] monthly at [facility] urology  Interim History:  [name] is seen for routine followup today with his wife. He is a nice gentleman with the above diagnosis. He has been on Zytiga without problems. He states that he has nausea intermittently, but better since last visit. He has ongoing lower extremity edema despite Lasix BID. He has been trying to elevate his legs as much as possible. He denies chest pain, shortness of breath at rest, abdominal pain. He has dyspnea on exertion which is unchanged from his baseline. No hematuria or other bleeding noted. Denies back, hip, and shoulder pain. Appetite is good and weight is up by 5 lbs. He was evaluated int he ER this past week due to left  shoulder pain. Pain has resolved. CT of chest was performed int he ER. Negative for PE. Patient states left supraclavicular lymph node has grown over the past 2 weeks.  Medications: I have reviewed the patient's current medications.  Current Outpatient Prescriptions  Medication Sig Dispense Refill  . aspirin 81 MG tablet Take 81 mg by mouth daily.      . calcium citrate-vitamin D (CITRACAL+D) 315-200 MG-UNIT per tablet Take 1 tablet by mouth 2 (two) times daily.      . fish oil-omega-3 fatty acids 1000 MG capsule Take 2 g by mouth daily.      . furosemide (LASIX) 20 MG tablet Take 1 tablet (20 mg total) by mouth 2 (two) times daily.  60 tablet  1  . Multiple Vitamins-Minerals (MULTIVITAMIN WITH MINERALS) tablet Take 1 tablet by mouth daily.      . naproxen sodium (ANAPROX) 220 MG tablet Take 220 mg by mouth 2 (two) times daily with a meal.      . ondansetron (ZOFRAN) 8 MG tablet Take 1 tablet (8 mg total) by mouth every 12 (twelve) hours as needed for nausea.  30 tablet  1  . potassium chloride SA (K-DUR,KLOR-CON) 20 MEQ tablet Take 1 tablet (20 mEq total) by mouth daily.  30 tablet  1  . pravastatin (PRAVACHOL) 40 MG tablet Take 40 mg by mouth daily.      Marland Kitchen ZYTIGA 250 MG tablet TAKE 4 TABLETS BY MOUTH ONCE DAILY. TAKE ON EMPTY STOMACH 1 HOUR BEFORE OR 2 HOURS AFTER A MEAL.  120 tablet  0  No current facility-administered medications for this visit.     Allergies: No Known Allergies  Past Medical History, Surgical history, Social history, and Family History were reviewed and updated.  Review of Systems:  Remaining ROS negative.  Physical Exam: Blood pressure 158/91, pulse 119, temperature 97.3 F (36.3 C), temperature source Oral, resp. rate 20, height 5\' 11"  (1.803 m), weight 297 lb 12.8 oz (135.081 kg). ECOG: 1 General appearance: alert, cooperative and no distress Head: Normocephalic, without obvious abnormality, atraumatic Neck: no adenopathy, no carotid bruit, no JVD, supple,  symmetrical, trachea midline and thyroid not enlarged, symmetric, no tenderness/mass/nodules Lymph nodes: Cervical adenopathy: negative, Axillary adenopathy: negative and Supraclavicular adenopathy: left supraclaviclar node approximately 3.5 cm Heart:regular rate and rhythm, S1, S2 normal, no murmur, click, rub or gallop Lung:chest clear, no wheezing, rales, normal symmetric air entry, no tachypnea, retractions or cyanosis Abdomen: soft, non-tender, without masses or organomegaly EXT:no erythema, induration, or nodules. He has 1+ pitting edema bilaterally.   Lab Results: Lab Results  Component Value Date   WBC 10.5* 02/13/2013   HGB 12.8* 02/13/2013   HCT 38.8 02/13/2013   MCV 85.5 02/13/2013   PLT 267 02/13/2013     Chemistry      Component Value Date/Time   NA 137 02/13/2013 0930   NA 136 02/09/2013 1520   K 3.8 02/13/2013 0930   K 3.8 02/09/2013 1520   CL 101 02/09/2013 1520   CO2 26 02/13/2013 0930   CO2 27 02/09/2013 1520   BUN 11.4 02/13/2013 0930   BUN 14 02/09/2013 1520   CREATININE 0.8 02/13/2013 0930   CREATININE 1.04 02/09/2013 1520      Component Value Date/Time   CALCIUM 9.2 02/13/2013 0930   CALCIUM 8.6 02/09/2013 1520   ALKPHOS 223* 02/13/2013 0930   ALKPHOS 218* 02/09/2013 1520   AST 26 02/13/2013 0930   AST 38* 02/09/2013 1520   ALT 23 02/13/2013 0930   ALT 30 02/09/2013 1520   BILITOT 0.95 02/13/2013 0930   BILITOT 0.7 02/09/2013 1520     Results for Donald Blair (Donald Blair) as of 02/13/2013 09:41  Ref. Range 12/08/2012 08:41 01/10/2013 09:15  PSA Latest Range: <=4.00 ng/mL 104.40 (H) 100.80 (H)   Imaging:  CLINICAL DATA: Left shoulder pain, concern for pulmonary embolism,  short of breath  EXAM:  CT ANGIOGRAPHY CHEST WITH CONTRAST  TECHNIQUE:  Multidetector CT imaging of the chest was performed using the  standard protocol during bolus administration of intravenous  contrast. Multiplanar CT image reconstructions including MIPs were   obtained to evaluate the vascular anatomy.  CONTRAST: 90 mL Omnipaque  COMPARISON: Bone scan 11/14/2012, CT chest abdomen pelvis  11/14/2012  FINDINGS:  No filling defects within the pulmonary arteries to suggest acute  pulmonary embolism. No acute findings of the aorta or great vessels.  There is a large left supraclavicular / upper mediastinal nodal mass  surrounding the left subclavian artery which measures 4.1 x 4.5 cm  compared to 4.4 by 4.8 cm on prior for no significant change. No  pericardial fluid. Heart appears normal. Esophagus is normal.  Review of the lung parenchyma again demonstrates left lower lobe  atelectasis. No pleural fluid or pulmonary edema. No evidence of  pulmonary metastasis. Central airways are normal.  Limited view of the upper abdomen is unremarkable.  Review of the skeleton demonstrates of new lesions within the  thoracic spine at T4-5 and 6. .\ New lesion in the manubrium.  Interval increase in sclerotic metastasis within  the left and right  ribs.  Review of the MIP images confirms the above findings.  IMPRESSION:  1. No evidence of pulmonary embolism.  2. New sclerotic metastasis within the thoracic spine, sternum, and  ribs. This could explain patient's pain.  3. Nodal mass in the upper right mediastinum / supraclavicular nodal  station measuring up to 4.5 cm is unchanged.  Electronically Signed  By: Genevive Bi M.D.  On: 02/09/2013 18:42  Impression and Plan: This is a 72 year old gentleman with the following issues: 1. Castration resistant prostate cancer. He is currently on Zytiga. PSA has dropped from 140 down to 104 after approximately 3 weeks of therapy. PSA is pending today. Seen by Dr Clelia Croft who discussed switching treatment to [facility], chemotherapy, or radiation to left supraclavicular lymph node if PSA goes up. Recommend that he continue Zytiga for for now without dose modification. We will call him with his PSA from today. 2. Androgen  deprivation. He will continue on Lupron under the care of Dr. Laverle Patter. 3. Bone directed therapy. He will continue on Xgeva under the care of Dr. Laverle Patter. 4. Nausea. Likely related to his Zytiga. He has Zofran at home. 5. Lower extremity edema. Continue Lasix 20 mg twice a day. He was encouraged to elevate his legs as much as possible. 6. Followup. He will return in approximately 4 weeks.     Clenton Pare 11/18/201410:33 AM  Patient seen and examined today. He is feeling well with no pain noted. He has reported increase in his supraclavicular lymph node.  On physical examination, he is awake alert oriented gentleman and not in any distress. He does have an enlarging left supraclavicular lymph node today. His chest wall examination did not show any other abnormalities. His heart was regular rate and rhythm and his lungs are clear to auscultation. He does have what 1+ lower extremity edema.  Laboratory data were reviewed today and his PSA did show a slight drop from 104 to 100 and his potassium is normal.  The plan at be at this point is to continue Zytiga as long as his PSA is responding. If his PSA started rising then I will consider treatment options which I have discussed with him personally today. These would include a salvage therapy with Xtandi, systemic chemotherapy and possible radiation therapy to his enlarged cervical lymph node. All his questions were answered today to his satisfaction.  [facility] 02/13/2013

## 2013-02-13 NOTE — Telephone Encounter (Signed)
gv and printed appt sched and avs for pt for Dec °

## 2013-02-13 NOTE — Addendum Note (Signed)
Addended by: Benjiman Core on: 02/13/2013 10:54 AM   Modules accepted: Level of Service

## 2013-02-14 ENCOUNTER — Telehealth: Payer: Self-pay | Admitting: *Deleted

## 2013-02-14 LAB — PSA: PSA: 133.5 ng/mL — ABNORMAL HIGH (ref <=4.00)

## 2013-02-14 NOTE — Telephone Encounter (Signed)
Spoke with wife, gave PSA results and dr Clelia Croft aware of elevated results, but to continue on the same medication for now. Wife verbalizes understanding.

## 2013-02-14 NOTE — Telephone Encounter (Signed)
Message copied by Reesa Chew on Wed Feb 14, 2013  3:48 PM ------      Message from: Benjiman Core      Created: Wed Feb 14, 2013 10:37 AM       Please call his PSA. Slight increase but he is to continue on Zytiga till next visit. ------

## 2013-03-05 ENCOUNTER — Telehealth: Payer: Self-pay | Admitting: *Deleted

## 2013-03-05 NOTE — Telephone Encounter (Signed)
Reports cold symtoms-cough and scratchy throat and congestion. No fever or dyspnea. Wants to take Mucinex bid. Asking if OK to take this with his Zytiga therapy. Told him I could see no drug interactions with this in [facility]-OK to take. Stressed importance of hydration and to call if he develops fever.

## 2013-03-08 ENCOUNTER — Other Ambulatory Visit: Payer: Self-pay

## 2013-03-08 DIAGNOSIS — C61 Malignant neoplasm of prostate: Secondary | ICD-10-CM

## 2013-03-08 MED ORDER — FUROSEMIDE 20 MG PO TABS: 20.0000 mg | ORAL_TABLET | Freq: Two times a day (BID) | ORAL | Status: DC

## 2013-03-09 ENCOUNTER — Other Ambulatory Visit: Payer: Self-pay | Admitting: Oncology

## 2013-03-09 MED ORDER — ABIRATERONE ACETATE 250 MG PO TABS: ORAL_TABLET | ORAL | Status: DC

## 2013-03-09 NOTE — Telephone Encounter (Signed)
OK per Dr.Shadad to refill X 1

## 2013-03-27 ENCOUNTER — Telehealth: Payer: Self-pay | Admitting: *Deleted

## 2013-03-27 ENCOUNTER — Ambulatory Visit (HOSPITAL_BASED_OUTPATIENT_CLINIC_OR_DEPARTMENT_OTHER): Payer: Medicare Other | Admitting: Oncology

## 2013-03-27 ENCOUNTER — Encounter: Payer: Self-pay | Admitting: Oncology

## 2013-03-27 ENCOUNTER — Other Ambulatory Visit (HOSPITAL_BASED_OUTPATIENT_CLINIC_OR_DEPARTMENT_OTHER): Payer: Medicare Other

## 2013-03-27 VITALS — BP 159/89 | HR 118 | Temp 97.9°F | Resp 20 | Ht 71.0 in | Wt 296.7 lb

## 2013-03-27 DIAGNOSIS — C801 Malignant (primary) neoplasm, unspecified: Secondary | ICD-10-CM

## 2013-03-27 DIAGNOSIS — C61 Malignant neoplasm of prostate: Secondary | ICD-10-CM

## 2013-03-27 DIAGNOSIS — R599 Enlarged lymph nodes, unspecified: Secondary | ICD-10-CM

## 2013-03-27 DIAGNOSIS — R609 Edema, unspecified: Secondary | ICD-10-CM

## 2013-03-27 DIAGNOSIS — C7951 Secondary malignant neoplasm of bone: Secondary | ICD-10-CM

## 2013-03-27 LAB — CBC WITH DIFFERENTIAL/PLATELET
BASO%: 1.2 % (ref 0.0–2.0)
Basophils Absolute: 0.1 10*3/uL (ref 0.0–0.1)
Eosinophils Absolute: 0.6 10*3/uL — ABNORMAL HIGH (ref 0.0–0.5)
HGB: 12.8 g/dL — ABNORMAL LOW (ref 13.0–17.1)
MCV: 85.6 fL (ref 79.3–98.0)
MONO%: 7.2 % (ref 0.0–14.0)
NEUT#: 4.8 10*3/uL (ref 1.5–6.5)
RBC: 4.66 10*6/uL (ref 4.20–5.82)
RDW: 15.2 % — ABNORMAL HIGH (ref 11.0–14.6)
WBC: 7.7 10*3/uL (ref 4.0–10.3)
lymph#: 1.6 10*3/uL (ref 0.9–3.3)

## 2013-03-27 LAB — COMPREHENSIVE METABOLIC PANEL (CC13)
AST: 50 U/L — ABNORMAL HIGH (ref 5–34)
Albumin: 3.3 g/dL — ABNORMAL LOW (ref 3.5–5.0)
Alkaline Phosphatase: 363 U/L — ABNORMAL HIGH (ref 40–150)
Calcium: 8.7 mg/dL (ref 8.4–10.4)
Chloride: 99 mEq/L (ref 98–109)
Glucose: 103 mg/dl (ref 70–140)
Potassium: 3.3 mEq/L — ABNORMAL LOW (ref 3.5–5.1)
Sodium: 139 mEq/L (ref 136–145)
Total Bilirubin: 0.78 mg/dL (ref 0.20–1.20)
Total Protein: 7 g/dL (ref 6.4–8.3)

## 2013-03-27 LAB — PSA: PSA: 237.8 ng/mL — ABNORMAL HIGH (ref <=4.00)

## 2013-03-27 MED ORDER — ENZALUTAMIDE 40 MG PO CAPS: 160.0000 mg | ORAL_CAPSULE | Freq: Every day | ORAL | Status: DC

## 2013-03-27 NOTE — Progress Notes (Signed)
Script for xtandi given to managed care for financial assistance

## 2013-03-27 NOTE — Progress Notes (Signed)
Hematology and Oncology Follow Up Visit  Donald Blair [id number] 07-Aug-1940 72 y.o. 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 His initial diagnosis was in 2005 with a Gleason score 3+4 equals 7 and a PSA of 7.  Prior Therapy:  1. He underwent external beam radiation with seed implants completed in July 2005. 2. He developed PSA recurrence in July 2010 but his prostate biopsy was negative at that time. 3. He started androgen deprivation February 2011 due to a rapid doubling of his PSA. 4. He received intermittent androgen deprivation in February 2012 until March 2013 her he developed a rise in his PSA indicating castration resistant disease. 5. He was treated as part of the STRIVT trial in May of 2013 after developing measurable disease with retroperitoneal lymphadenopathy and bone metastasis. 6. In January 2014 he developed further progression of his disease and was treated with Provenge immune therapy in April 2014.   Current therapy:  1. Zytiga 1000 mg daily started on 11/14/2012 2. He receives Lupron at [facility] 3. He receives [place] monthly at [facility]. This will be given at the cancer Center starting on 04/27/2013.  Interim History:  Donald Blair is seen for routine followup today with his wife. He is a nice gentleman with the above diagnosis. He has been on Zytiga with few problems since the last time. He states that he has nausea intermittently, but better since last visit. He has ongoing lower extremity edema despite Lasix BID. He has been trying to elevate his legs as much as possible with little help. He denies chest pain, shortness of breath at rest, abdominal pain. He has dyspnea on exertion which is unchanged from his baseline. No hematuria or other bleeding noted. Denies back, hip, and shoulder pain.  Appetite is good and weight is up as well. He had developed a upper respiratory infection and developed congestion and cough without any fevers. He is reporting that his left supraclavicular lymph node has been enlarging. Overall his performance status and activity level all limited but unchanged at this time.  Medications: I have reviewed the patient's current medications.  Current Outpatient Prescriptions  Medication Sig Dispense Refill  . aspirin 81 MG tablet Take 81 mg by mouth daily.      . calcium citrate-vitamin D (CITRACAL+D) 315-200 MG-UNIT per tablet Take 1 tablet by mouth 2 (two) times daily.      . fish oil-omega-3 fatty acids 1000 MG capsule Take 2 g by mouth daily.      . furosemide (LASIX) 20 MG tablet Take 1 tablet (20 mg total) by mouth 2 (two) times daily.  60 tablet  2  . Multiple Vitamins-Minerals (MULTIVITAMIN WITH MINERALS) tablet Take 1 tablet by mouth daily.      . naproxen sodium (ANAPROX) 220 MG tablet Take 220 mg by mouth 2 (two) times daily with a meal.      . ondansetron (ZOFRAN) 8 MG tablet Take 1 tablet (8 mg total) by mouth every 12 (twelve) hours as needed for nausea.  30 tablet  1  . potassium chloride SA (K-DUR,KLOR-CON) 20 MEQ tablet Take 1 tablet (20 mEq total) by mouth daily.  30 tablet  1  . pravastatin (PRAVACHOL) 40 MG tablet Take 40 mg by mouth daily.      . enzalutamide (XTANDI) 40 MG capsule Take 4 capsules (160 mg total)  by mouth daily.  120 capsule  0   No current facility-administered medications for this visit.     Allergies: No Known Allergies  Past Medical History, Surgical history, Social history, and Family History were reviewed and updated.  Review of Systems:  Remaining ROS negative.  Physical Exam: Blood pressure 159/89, pulse 118, temperature 97.9 F (36.6 C), temperature source Oral, resp. rate 20, height 5\' 11"  (1.803 m), weight 296 lb 11.2 oz (134.582 kg). ECOG: 1 General appearance: alert, cooperative and no distress Head:  Normocephalic, without obvious abnormality, atraumatic Neck: no adenopathy, no carotid bruit, no JVD, supple, symmetrical, trachea midline and thyroid not enlarged, symmetric, no tenderness/mass/nodules Lymph nodes: Cervical adenopathy: negative, Axillary adenopathy: negative and Supraclavicular adenopathy: left supraclaviclar node approximately 3.5 cm slightly enlarged. Heart:regular rate and rhythm, S1, S2 normal, no murmur, click, rub or gallop Lung:chest clear, no wheezing, rales, normal symmetric air entry, no tachypnea, retractions or cyanosis Abdomen: soft, non-tender, without masses or organomegaly EXT:no erythema, induration, or nodules. He has 1+ pitting edema bilaterally.   Lab Results: Lab Results  Component Value Date   WBC 7.7 03/27/2013   HGB 12.8* 03/27/2013   HCT 39.9 03/27/2013   MCV 85.6 03/27/2013   PLT 309 03/27/2013     Chemistry      Component Value Date/Time   NA 137 02/13/2013 0930   NA 136 02/09/2013 1520   K 3.8 02/13/2013 0930   K 3.8 02/09/2013 1520   CL 101 02/09/2013 1520   CO2 26 02/13/2013 0930   CO2 27 02/09/2013 1520   BUN 11.4 02/13/2013 0930   BUN 14 02/09/2013 1520   CREATININE 0.8 02/13/2013 0930   CREATININE 1.04 02/09/2013 1520      Component Value Date/Time   CALCIUM 9.2 02/13/2013 0930   CALCIUM 8.6 02/09/2013 1520   ALKPHOS 223* 02/13/2013 0930   ALKPHOS 218* 02/09/2013 1520   AST 26 02/13/2013 0930   AST 38* 02/09/2013 1520   ALT 23 02/13/2013 0930   ALT 30 02/09/2013 1520   BILITOT 0.95 02/13/2013 0930   BILITOT 0.7 02/09/2013 1520       Results for JASAI, SORG (MRN [id number]) as of 03/27/2013 08:51  Ref. Range 01/10/2013 09:15 02/13/2013 09:30  PSA Latest Range: <=4.00 ng/mL 100.80 (H) 133.50 (H)    Impression and Plan:  This is a 72 year old gentleman with the following issues: 1. Castration resistant prostate cancer. He is currently on Zytiga. PSA has dropped from 140 down to 104 after approximately 3  weeks of therapy but now it's back up again to 133.5 on 02/13/2013. Given the side effects associated with Zytiga that includes lower extremity swelling and his rising PSA I have instructed him to stop it immediately. Options of treatments were discussed again today including Xtandi versus systemic chemotherapy. Risks and benefits of those approaches were discussed today and he have elected to proceed with Xtandi at this time. I will get that started on him as soon as possible.  2. Androgen deprivation. He will continue on Lupron. I will be happy to give that to him in the future.  3. Bone directed therapy. He will continue on Xgeva here at the [place] [facility] center based on his request. I have sent the preauthorization in anticipation of starting this medicine on 04/27/2013. Risks and benefits including dental complications and hypocalcemia were discussed today and he is agreeable to proceed with the next visit.  4. Nausea. Likely related to his Zytiga. He has Zofran at  home.  5. Lower extremity edema. Continue Lasix 20 mg twice a day. He was encouraged to elevate his legs as much as possible. I've instructed him to continue this but if his symptoms improve with stopping Zytiga, I've instructed him to stop the Lasix and potassium supplements as well.  6. Enlarging left supraclavicular lymph node: I will refer him to radiation oncology for palliative radiation treatment to that area.  7. Followup. He will return in approximately 4 weeks.     Stephaney Steven 12/30/20149:15 AM

## 2013-03-27 NOTE — Progress Notes (Signed)
[employer], [id number], approved xtandi 40mg  from 03/27/13-03/27/14 id # [id number].

## 2013-03-27 NOTE — Telephone Encounter (Signed)
appts made and printed...td 

## 2013-03-27 NOTE — Progress Notes (Signed)
Faxed xtandi prescription to [facility] °

## 2013-03-28 ENCOUNTER — Encounter: Payer: Self-pay | Admitting: *Deleted

## 2013-03-28 DIAGNOSIS — C7951 Secondary malignant neoplasm of bone: Secondary | ICD-10-CM | POA: Insufficient documentation

## 2013-03-28 DIAGNOSIS — C801 Malignant (primary) neoplasm, unspecified: Secondary | ICD-10-CM | POA: Insufficient documentation

## 2013-04-04 ENCOUNTER — Encounter: Payer: Self-pay | Admitting: Radiation Oncology

## 2013-04-04 DIAGNOSIS — C770 Secondary and unspecified malignant neoplasm of lymph nodes of head, face and neck: Secondary | ICD-10-CM | POA: Insufficient documentation

## 2013-04-04 NOTE — Progress Notes (Signed)
Primary cancer: prostate , s/p xrt w/seed implants July 2005.  Prior therapy: S/p xrt w/seed implant completed July 2005 Prostate biopsy 2010 benign Androgen deprivation Feb 2011 due to increasing PSA Intermittent androgen deprivation Feb 2012 to Mar 2013 PSA continued to rise indicating castration resistant disease. 02/13/13 PSA 133.5 STRIVT trial may 2013 after developing measurable disease w/retroperitoneal lymphadenopathy and bone metastasis. Jan 2014 developed further progression, tx w/Provenge immune therapy April 2014.  Current therapy: 1. Zytiga 1000 mg daily started on 11/14/2012  2. He receives Lupron  30 mg at [facility]  Last given 03/14/13  3. He receives Xgeva  120mg  (03/14/13 given)monthly at [facility]. This will be given at the [place] starting on 04/27/2013.  03/27/13 Pt reported that his left supraclavicular lymph node has been enlarging. Referred to radiation oncology for palliative treatment to left supraclav node per Dr Alen Blew.   No c/o -pain stated, sob claustrophobic,walked the stairs, still has some hoty flashes better than they used to be stated patient,  Right shoulder surgery  July 2015, 2 tears, just completed physicl therapy 12 weeks right before Christmas

## 2013-04-12 ENCOUNTER — Ambulatory Visit
Admission: RE | Admit: 2013-04-12 | Discharge: 2013-04-12 | Disposition: A | Discharge status: Home / Self Care | Payer: Medicare Other | Source: Ambulatory Visit | Attending: Radiation Oncology | Admitting: Radiation Oncology

## 2013-04-12 ENCOUNTER — Other Ambulatory Visit: Payer: Self-pay | Admitting: Radiation Oncology

## 2013-04-12 ENCOUNTER — Telehealth: Payer: Self-pay | Admitting: *Deleted

## 2013-04-12 ENCOUNTER — Encounter: Payer: Self-pay | Admitting: Radiation Oncology

## 2013-04-12 VITALS — BP 153/84 | HR 114 | Temp 97.5°F | Resp 24 | Wt 284.5 lb

## 2013-04-12 DIAGNOSIS — Z7982 Long term (current) use of aspirin: Secondary | ICD-10-CM | POA: Insufficient documentation

## 2013-04-12 DIAGNOSIS — R590 Localized enlarged lymph nodes: Secondary | ICD-10-CM

## 2013-04-12 DIAGNOSIS — C61 Malignant neoplasm of prostate: Secondary | ICD-10-CM | POA: Insufficient documentation

## 2013-04-12 DIAGNOSIS — C7951 Secondary malignant neoplasm of bone: Secondary | ICD-10-CM | POA: Insufficient documentation

## 2013-04-12 DIAGNOSIS — Z79899 Other long term (current) drug therapy: Secondary | ICD-10-CM | POA: Insufficient documentation

## 2013-04-12 DIAGNOSIS — J029 Acute pharyngitis, unspecified: Secondary | ICD-10-CM | POA: Insufficient documentation

## 2013-04-12 DIAGNOSIS — Z510 Encounter for antineoplastic radiation therapy: Secondary | ICD-10-CM | POA: Insufficient documentation

## 2013-04-12 DIAGNOSIS — K209 Esophagitis, unspecified without bleeding: Secondary | ICD-10-CM | POA: Insufficient documentation

## 2013-04-12 DIAGNOSIS — C770 Secondary and unspecified malignant neoplasm of lymph nodes of head, face and neck: Secondary | ICD-10-CM

## 2013-04-12 DIAGNOSIS — C50919 Malignant neoplasm of unspecified site of unspecified female breast: Secondary | ICD-10-CM | POA: Insufficient documentation

## 2013-04-12 DIAGNOSIS — E785 Hyperlipidemia, unspecified: Secondary | ICD-10-CM | POA: Insufficient documentation

## 2013-04-12 DIAGNOSIS — Z87891 Personal history of nicotine dependence: Secondary | ICD-10-CM | POA: Insufficient documentation

## 2013-04-12 DIAGNOSIS — C7952 Secondary malignant neoplasm of bone marrow: Secondary | ICD-10-CM

## 2013-04-12 DIAGNOSIS — C779 Secondary and unspecified malignant neoplasm of lymph node, unspecified: Secondary | ICD-10-CM

## 2013-04-12 HISTORY — DX: Malignant neoplasm of prostate: C61

## 2013-04-12 HISTORY — DX: Localized enlarged lymph nodes: R59.0

## 2013-04-12 MED ORDER — LORAZEPAM 1 MG PO TABS: ORAL_TABLET | ORAL | Status: DC

## 2013-04-12 NOTE — Progress Notes (Signed)
Complex simulation/treatment planning note: The patient was taken to the CT simulator. A custom head cast was constructed for immobilization. He was then scanned. I chose and isocenter along his left neck mass. I contoured his CTV 35, and this would be expanded by 0.5 cm to create PTV 35. I also contoured his pharynx and esophagus. He will also have contouring of his spinal cord. I prescribing 3500 cGy in 14 sessions. I'm also requesting daily cone beam CT for image guidance.

## 2013-04-12 NOTE — Progress Notes (Signed)
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 male who is seen today through the courtesy of Dr. Alen Blair for consideration of pale to radiotherapy to his left clavicular region in the management of his metastatic carcinoma prostate. 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 He underwent seed implantation alone and apparently developed a PSA recurrence and 2010. Prostate biopsies at that time were negative. He was started on androgen deprivation therapy February 2011 he does have a rapid PSA doubling time. More recently, he developed castrate resistant disease and he was treated on the STRIVE trial MA 2013 after developing measurable disease with retroperitoneal lymphadenopathy and bone metastases. He developed further disease progression by January 2014 and was treated with Provenge  in April 2014. More recently he was started on Zytiga in August 2014. He continues to receive Lupron and Delton See he is now on Tatums as well. In the past few months he has had an enlarging supraclavicular mass which on CT from 02/09/2013 measured 4.5 cm. There also sclerotic metastases to the thoracic spine, sternum, and ribs. He takes Aleve when necessary pain.  PREVIOUS RADIATION THERAPY: Prostate seed implant in 2005.   PAST MEDICAL HISTORY:  has a past medical history of Cancer; Hyperlipidemia; Wears glasses; Shortness of breath; Arthritis; Metastasis to bone (02/09/13); Lymphadenopathy, supraclavicular; and Prostate cancer (2005).     PAST SURGICAL HISTORY:   Past Surgical History  Procedure Laterality Date  . Tonsillectomy    . Hernia repair  1972    lt ing  . Knee arthroscopy  1999    right  . Knee arthroscopy  2001    left  . Transperineal implant of radiation seeds w/ ultrasound  2005    prostate  . Shoulder arthroscopy with rotator cuff repair Right 10/03/2012    Procedure: RIGHT SHOULDER ARTHROSCOPY WITH OPEN ROTATOR CUFF REPAIR AND DISTAL CLAVICLE EXCISION;  Surgeon: Cammie Sickle., MD;  Location: [place];  Service: Orthopedics;  Laterality: Right;     FAMILY HISTORY: His father died of a stroke at 50. His mother died from a heart attack at 35. No family history of prostate cancer.  SOCIAL HISTORY:  reports that he has quit smoking. He quit smokeless tobacco use about 40 years ago. He reports that he does not drink alcohol or use illicit drugs. Married, 2 children. Retired from the [employer]: Review of patient's allergies indicates no known allergies.   MEDICATIONS:  Current Outpatient Prescriptions  Medication Sig Dispense Refill  . aspirin 81 MG tablet Take 81 mg by mouth daily.      . calcium citrate-vitamin D (CITRACAL+D) 315-200 MG-UNIT per tablet Take 1 tablet by mouth 2 (two) times daily.      . enzalutamide (XTANDI) 40 MG capsule Take 4 capsules (160 mg total) by mouth daily.  120 capsule  0  . furosemide (LASIX) 20 MG tablet Take 1 tablet (20 mg total) by mouth 2 (two) times daily.  60 tablet  2  . Multiple Vitamins-Minerals (MULTIVITAMIN WITH MINERALS) tablet Take 1 tablet by mouth daily.      . naproxen sodium (ANAPROX) 220 MG tablet Take 220 mg by mouth 2 (two) times daily with a meal.      . ondansetron (ZOFRAN) 8 MG tablet Take 1 tablet (8 mg total) by mouth every 12 (twelve) hours as needed for nausea.  30 tablet  1  . potassium chloride SA (K-DUR,KLOR-CON) 20 MEQ tablet Take 1 tablet (20 mEq total) by mouth daily.  30 tablet  1  . pravastatin (PRAVACHOL) 40  MG tablet Take 40 mg by mouth daily.      . fish oil-omega-3 fatty acids 1000 MG capsule Take 2 g by mouth daily.       No current facility-administered medications for this encounter.     REVIEW OF SYSTEMS:  Pertinent items are noted in HPI.    PHYSICAL EXAM:  weight is 284 lb 8 oz (129.048 kg). His oral temperature is 97.5 F (36.4 C). His blood pressure is 153/84 and his pulse is 114. His respiration is 24 and oxygen saturation is 96%.   Alert and oriented. No acute distress. Head and neck examination: He appears slightly cushingoid. There is a baseball sized mass along the left supraclavicular region/lower neck which is slightly mobile. There are distended veins. Chest: Lungs clear. Abdomen: Without hepatomegaly. Extremities: Trace ankle edema. Neurologic examination: Grossly nonfocal.   LABORATORY DATA:  Lab Results  Component Value Date   WBC 7.7 03/27/2013   HGB 12.8* 03/27/2013   HCT 39.9 03/27/2013   MCV 85.6 03/27/2013   PLT 309 03/27/2013   Lab Results  Component Value Date   NA 139 03/27/2013   K 3.3* 03/27/2013   CL 101 02/09/2013   CO2 28 03/27/2013   Lab Results  Component Value Date   ALT 23 03/27/2013   AST 50* 03/27/2013   ALKPHOS 363* 03/27/2013   BILITOT 0.78 03/27/2013   PSA 237 from December 2014, 104 from September 2014   IMPRESSION: Metastatic castrate resistant carcinoma the prostate with significant growth of a left supraclavicular mass. I feel that he would benefit from a 2 one half week course of palliative radiotherapy. The treatment should be well tolerated. I discussed the potential acute and late toxicities of radiation therapy. We'll move ahead with simulation/treatment planning today.   PLAN: As discussed above. He'll begin his treatment mid next week.  I spent 40 minutes minutes face to face with the patient and more than 50% of that time was spent in counseling and/or coordination of care.

## 2013-04-12 NOTE — Telephone Encounter (Signed)
Called [facility] [phone], left voice message for patient RX of Ativan;   Ativan 1mg  po  Take one daily by mouth 30 minutes before daily treatments, dispense 30 tabs,no refills, can call back for any questions, have written rx on hand signed by MD will call patient wife at [phone] called and left vm ready to pick up in a few hours or tomorrow 2:39 PM

## 2013-04-16 ENCOUNTER — Encounter: Payer: Self-pay | Admitting: Radiation Oncology

## 2013-04-16 NOTE — Progress Notes (Signed)
3-D simulation note: The patient with 3-D simulation for treatment to his left neck/upper chest. Dose volume histograms were obtained for the target structures and also esophagus/spinal cord. He was set up to 3 field technique, AP, LAO, and PA. 3 sets of multileaf collimators were designed to conform the field. I prescribing 3500 cGy in 14 sessions utilizing mixed 10 MV and 18 MV photons. I  Am requesting daily cone beam CT for image guidance.

## 2013-04-18 ENCOUNTER — Ambulatory Visit
Admission: RE | Admit: 2013-04-18 | Discharge: 2013-04-18 | Disposition: A | Discharge status: Home / Self Care | Payer: Medicare Other | Source: Ambulatory Visit | Attending: Radiation Oncology | Admitting: Radiation Oncology

## 2013-04-18 DIAGNOSIS — C770 Secondary and unspecified malignant neoplasm of lymph nodes of head, face and neck: Secondary | ICD-10-CM

## 2013-04-18 NOTE — Progress Notes (Signed)
Simulation verification note: The patient underwent simulation verification for treatment to his left neck. His isocenter is in good position and the multileaf collimators contoured the treatment volume appropriately.

## 2013-04-19 ENCOUNTER — Ambulatory Visit
Admission: RE | Admit: 2013-04-19 | Discharge: 2013-04-19 | Disposition: A | Discharge status: Home / Self Care | Payer: Medicare Other | Source: Ambulatory Visit | Attending: Radiation Oncology | Admitting: Radiation Oncology

## 2013-04-20 ENCOUNTER — Ambulatory Visit
Admission: RE | Admit: 2013-04-20 | Discharge: 2013-04-20 | Disposition: A | Discharge status: Home / Self Care | Payer: Medicare Other | Source: Ambulatory Visit | Attending: Radiation Oncology | Admitting: Radiation Oncology

## 2013-04-23 ENCOUNTER — Ambulatory Visit
Admission: RE | Admit: 2013-04-23 | Discharge: 2013-04-23 | Disposition: A | Discharge status: Home / Self Care | Payer: Medicare Other | Source: Ambulatory Visit | Attending: Radiation Oncology | Admitting: Radiation Oncology

## 2013-04-23 ENCOUNTER — Encounter: Payer: Self-pay | Admitting: Radiation Oncology

## 2013-04-23 VITALS — BP 150/93 | HR 116 | Temp 97.5°F | Resp 20 | Wt 285.6 lb

## 2013-04-23 DIAGNOSIS — C770 Secondary and unspecified malignant neoplasm of lymph nodes of head, face and neck: Secondary | ICD-10-CM

## 2013-04-23 NOTE — Progress Notes (Signed)
Weekly Management Note:  Site: Left neck/upper mediastinum Current Dose: 1000 cGy Projected Dose: 3500  cGy  Narrative: The patient is seen today for routine under treatment assessment. CBCT/MVCT images/port films were reviewed. The chart was reviewed.   He is clinical stable and is without complaints today. No change in his dyspnea. His weight remains stable. No odynophagia.  Physical Examination:  Filed Vitals:   04/23/13 1129  BP: 150/93  Pulse: 116  Temp: 97.5 F (36.4 C)  Resp: 20  .  Weight: 285 lb 9.6 oz (129.547 kg). I believe there is some softening of his left neck adenopathy.  Impression: Tolerating radiation therapy well.  Plan: Continue radiation therapy as planned.

## 2013-04-23 NOTE — Addendum Note (Signed)
Encounter addended by: Andria Rhein, RN on: 04/23/2013  2:12 PM<BR>     Documentation filed: Inpatient Patient Education

## 2013-04-23 NOTE — Progress Notes (Addendum)
Pt denies pain, loss of appetite. He is fatigued. He states his "fluid has built up". Pt taking Lasix 20 mg once daily. Offered to take pt upstairs by wheelchair. He states he "doesn't ride elevators anymore due to claustrophobia". Pt states he takes stairs and "takes his time".  Post sim ed completed w/pt. Gave pt "Radiation and You" booklet w/all pertinent information marked and discussed,re: fatigue, skin irritation/care, throat irritation/care, nutrition, pain. All questions answered; pt verbalized understanding.

## 2013-04-24 ENCOUNTER — Ambulatory Visit
Admission: RE | Admit: 2013-04-24 | Discharge: 2013-04-24 | Disposition: A | Discharge status: Home / Self Care | Payer: Medicare Other | Source: Ambulatory Visit | Attending: Radiation Oncology | Admitting: Radiation Oncology

## 2013-04-25 ENCOUNTER — Ambulatory Visit
Admission: RE | Admit: 2013-04-25 | Discharge: 2013-04-25 | Disposition: A | Discharge status: Home / Self Care | Payer: Medicare Other | Source: Ambulatory Visit | Attending: Radiation Oncology | Admitting: Radiation Oncology

## 2013-04-26 ENCOUNTER — Ambulatory Visit
Admission: RE | Admit: 2013-04-26 | Discharge: 2013-04-26 | Disposition: A | Discharge status: Home / Self Care | Payer: Medicare Other | Source: Ambulatory Visit | Attending: Radiation Oncology | Admitting: Radiation Oncology

## 2013-04-27 ENCOUNTER — Ambulatory Visit
Admission: RE | Admit: 2013-04-27 | Discharge: 2013-04-27 | Disposition: A | Discharge status: Home / Self Care | Payer: Medicare Other | Source: Ambulatory Visit | Attending: Radiation Oncology | Admitting: Radiation Oncology

## 2013-04-27 ENCOUNTER — Encounter: Payer: Self-pay | Admitting: Oncology

## 2013-04-27 ENCOUNTER — Telehealth: Payer: Self-pay | Admitting: Oncology

## 2013-04-27 ENCOUNTER — Ambulatory Visit (HOSPITAL_BASED_OUTPATIENT_CLINIC_OR_DEPARTMENT_OTHER): Payer: Medicare Other | Admitting: Oncology

## 2013-04-27 ENCOUNTER — Other Ambulatory Visit (HOSPITAL_BASED_OUTPATIENT_CLINIC_OR_DEPARTMENT_OTHER): Payer: Medicare Other

## 2013-04-27 VITALS — BP 120/80 | HR 106 | Temp 97.5°F | Resp 20 | Ht 71.0 in | Wt 284.7 lb

## 2013-04-27 DIAGNOSIS — C7951 Secondary malignant neoplasm of bone: Secondary | ICD-10-CM

## 2013-04-27 DIAGNOSIS — C786 Secondary malignant neoplasm of retroperitoneum and peritoneum: Secondary | ICD-10-CM

## 2013-04-27 DIAGNOSIS — C61 Malignant neoplasm of prostate: Secondary | ICD-10-CM

## 2013-04-27 DIAGNOSIS — E291 Testicular hypofunction: Secondary | ICD-10-CM

## 2013-04-27 DIAGNOSIS — C7952 Secondary malignant neoplasm of bone marrow: Secondary | ICD-10-CM

## 2013-04-27 DIAGNOSIS — R599 Enlarged lymph nodes, unspecified: Secondary | ICD-10-CM

## 2013-04-27 LAB — COMPREHENSIVE METABOLIC PANEL (CC13)
ALK PHOS: 400 U/L — AB (ref 40–150)
ALT: 31 U/L (ref 0–55)
AST: 48 U/L — ABNORMAL HIGH (ref 5–34)
Albumin: 3.2 g/dL — ABNORMAL LOW (ref 3.5–5.0)
Anion Gap: 8 mEq/L (ref 3–11)
BUN: 17.5 mg/dL (ref 7.0–26.0)
CHLORIDE: 103 meq/L (ref 98–109)
CO2: 27 mEq/L (ref 22–29)
CREATININE: 0.8 mg/dL (ref 0.7–1.3)
Calcium: 9.2 mg/dL (ref 8.4–10.4)
Glucose: 111 mg/dl (ref 70–140)
POTASSIUM: 4.6 meq/L (ref 3.5–5.1)
Sodium: 138 mEq/L (ref 136–145)
Total Bilirubin: 0.64 mg/dL (ref 0.20–1.20)
Total Protein: 7.1 g/dL (ref 6.4–8.3)

## 2013-04-27 LAB — CBC WITH DIFFERENTIAL/PLATELET
BASO%: 0.7 % (ref 0.0–2.0)
BASOS ABS: 0.1 10*3/uL (ref 0.0–0.1)
EOS ABS: 0.4 10*3/uL (ref 0.0–0.5)
EOS%: 4.2 % (ref 0.0–7.0)
HCT: 40.4 % (ref 38.4–49.9)
HGB: 13.4 g/dL (ref 13.0–17.1)
LYMPH%: 13.6 % — ABNORMAL LOW (ref 14.0–49.0)
MCH: 28.2 pg (ref 27.2–33.4)
MCHC: 33.2 g/dL (ref 32.0–36.0)
MCV: 85 fL (ref 79.3–98.0)
MONO#: 0.6 10*3/uL (ref 0.1–0.9)
MONO%: 6.9 % (ref 0.0–14.0)
NEUT%: 74.6 % (ref 39.0–75.0)
NEUTROS ABS: 6.2 10*3/uL (ref 1.5–6.5)
Platelets: 303 10*3/uL (ref 140–400)
RBC: 4.76 10*6/uL (ref 4.20–5.82)
RDW: 15.7 % — AB (ref 11.0–14.6)
WBC: 8.3 10*3/uL (ref 4.0–10.3)
lymph#: 1.1 10*3/uL (ref 0.9–3.3)

## 2013-04-27 LAB — PSA: PSA: 530.6 ng/mL — ABNORMAL HIGH (ref <=4.00)

## 2013-04-27 MED ORDER — ENZALUTAMIDE 40 MG PO CAPS: 160.0000 mg | ORAL_CAPSULE | Freq: Every day | ORAL | Status: DC

## 2013-04-27 MED ORDER — DENOSUMAB 120 MG/1.7ML ~~LOC~~ SOLN
120.0000 mg | Freq: Once | SUBCUTANEOUS | Status: AC
  Administered 2013-04-27: 120 mg via SUBCUTANEOUS
  Filled 2013-04-27: qty 1.7

## 2013-04-27 NOTE — Progress Notes (Signed)
Hematology and Oncology Follow Up Visit  Donald Blair [id number] [birth year] 73 y.o. 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 His initial diagnosis was in 2005 with a Gleason score 3+4 equals 7 and a PSA of 7.  Prior Therapy:  1. He underwent external beam radiation with seed implants completed in July 2005. 2. He developed PSA recurrence in July 2010 but his prostate biopsy was negative at that time. 3. He started androgen deprivation February 2011 due to a rapid doubling of his PSA. 4. He received intermittent androgen deprivation in February 2012 until March 2013 her he developed a rise in his PSA indicating castration resistant disease. 5. He was treated as part of the STRIVT trial in May of 2013 after developing measurable disease with retroperitoneal lymphadenopathy and bone metastasis. 6. In January 2014 he developed further progression of his disease and was treated with Provenge immune therapy in April 2014. 7. Zytiga 1000 mg daily 11/14/12 through 03/27/13. Stopped due to rising PSA and edema.  Current therapy:  1. Xtandi 160 mg daily started on 03/2013 2. He receives Lupron at [facility] urology. Plans to begin receiving this here in April 2015. 3. He receives Niger monthly at [facility] urology. This will be given at the [place] starting on 04/27/2013.  Interim History:  Donald Blair is seen for routine followup today with his wife. He is a nice gentleman with the above diagnosis. He started [place] recently. Tolerating well. Edema has improved significantly since stopping Zytiga. He denies chest pain, shortness of breath at rest, abdominal pain. He has dyspnea on exertion which is unchanged from his baseline. No hematuria or other bleeding noted. Denies back, hip, and shoulder pain. Appetite is good. Weight is down  due to improved edema. Receiving XRT to left supraclavicular node. He thinks area is smaller and softer. Overall his performance status and activity level all limited but unchanged at this time.  Medications: I have reviewed the patient's current medications.  Current Outpatient Prescriptions  Medication Sig Dispense Refill  . aspirin 81 MG tablet Take 81 mg by mouth daily.      . calcium citrate-vitamin D (CITRACAL+D) 315-200 MG-UNIT per tablet Take 1 tablet by mouth 2 (two) times daily.      . enzalutamide (XTANDI) 40 MG capsule Take 4 capsules (160 mg total) by mouth daily.  120 capsule  0  . furosemide (LASIX) 20 MG tablet Take 1 tablet (20 mg total) by mouth 2 (two) times daily.  60 tablet  2  . Multiple Vitamins-Minerals (MULTIVITAMIN WITH MINERALS) tablet Take 1 tablet by mouth daily.      . ondansetron (ZOFRAN) 8 MG tablet Take 1 tablet (8 mg total) by mouth every 12 (twelve) hours as needed for nausea.  30 tablet  1  . potassium chloride SA (K-DUR,KLOR-CON) 20 MEQ tablet Take 1 tablet (20 mEq total) by mouth daily.  30 tablet  1  . pravastatin (PRAVACHOL) 40 MG tablet Take 40 mg by mouth daily.      . fish oil-omega-3 fatty acids 1000 MG capsule Take 2 g by mouth daily.      Marland Kitchen LORazepam (ATIVAN) 1 MG tablet Take one by mouth 30 minutes before daily treatment  30 tablet  0  . naproxen sodium (ANAPROX) 220 MG tablet Take 220 mg by mouth 2 (two) times daily with a meal.  No current facility-administered medications for this visit.     Allergies: No Known Allergies  Past Medical History, Surgical history, Social history, and Family History were reviewed and updated.  Review of Systems:  Remaining ROS negative.  Physical Exam: Blood pressure 120/80, pulse 106, temperature 97.5 F (36.4 C), temperature source Oral, resp. rate 20, height 5\' 11"  (1.803 m), weight 284 lb 11.2 oz (129.139 kg), SpO2 98.00%. ECOG: 1 General appearance: alert, cooperative and no distress Head:  Normocephalic, without obvious abnormality, atraumatic Neck: no adenopathy, no carotid bruit, no JVD, supple, symmetrical, trachea midline and thyroid not enlarged, symmetric, no tenderness/mass/nodules Lymph nodes: Cervical adenopathy: negative, Axillary adenopathy: negative and Supraclavicular adenopathy: left supraclaviclar node approximately 3.5 cm softer and getting smaller. Heart:regular rate and rhythm, S1, S2 normal, no murmur, click, rub or gallop Lung:chest clear, no wheezing, rales, normal symmetric air entry, no tachypnea, retractions or cyanosis Abdomen: soft, non-tender, without masses or organomegaly EXT:no erythema, induration, or nodules. He has 1+ edema bilaterally.   Lab Results: Lab Results  Component Value Date   WBC 8.3 04/27/2013   HGB 13.4 04/27/2013   HCT 40.4 04/27/2013   MCV 85.0 04/27/2013   PLT 303 04/27/2013     Chemistry      Component Value Date/Time   NA 138 04/27/2013 1010   NA 136 02/09/2013 1520   K 4.6 04/27/2013 1010   K 3.8 02/09/2013 1520   CL 101 02/09/2013 1520   CO2 27 04/27/2013 1010   CO2 27 02/09/2013 1520   BUN 17.5 04/27/2013 1010   BUN 14 02/09/2013 1520   CREATININE 0.8 04/27/2013 1010   CREATININE 1.04 02/09/2013 1520      Component Value Date/Time   CALCIUM 9.2 04/27/2013 1010   CALCIUM 8.6 02/09/2013 1520   ALKPHOS 400* 04/27/2013 1010   ALKPHOS 218* 02/09/2013 1520   AST 48* 04/27/2013 1010   AST 38* 02/09/2013 1520   ALT 31 04/27/2013 1010   ALT 30 02/09/2013 1520   BILITOT 0.64 04/27/2013 1010   BILITOT 0.7 02/09/2013 1520      Results for [name], [name] (MRN [id number]) as of 04/27/2013 11:19  Ref. Range 12/08/2012 08:41 01/10/2013 09:15 02/13/2013 09:30 03/27/2013 08:39  PSA Latest Range: <=4.00 ng/mL 104.40 (H) 100.80 (H) 133.50 (H) 237.80 (H)    Impression and Plan:  This is a 73 year old gentleman with the following issues: 1. Castration resistant prostate cancer. He is currently on Zytiga. PSA has dropped from 140  down to 104 after approximately 3 weeks of therapy but now it's back up again to 237 on 03/27/13. He has been switched to [place] and tolerating well. Plan is to continue Xtandi without dose modification. PSA pending today.   2. Androgen deprivation. He will continue on Lupron. He is due for his next Lupron on or after 07/13/13. Prior authorization was requested today so that he can begin to get that here at the [facility].  3. Bone directed therapy. He will continue on Xgeva here at the [facility] based on his request. Risks and benefits including dental complications and hypocalcemia were discussed today and he is agreeable to proceed.  4. Nausea. Better since stopping Zytiga. He has Zofran at home.  5. Lower extremity edema. Improved since stopping Zytiga.  6. Enlarging left supraclavicular lymph node: Receiving XRT to this area.  7. Followup. He will return in approximately 4 weeks.     [place], Deniyah Dillavou 1/30/20151:06 PM

## 2013-04-27 NOTE — Telephone Encounter (Signed)
gv and printed appt sched anda vs for pt for March.... °

## 2013-04-27 NOTE — Telephone Encounter (Signed)
gv and printed appt sched and avs for pt for March....gv pt first available with Dr Alen Blew

## 2013-04-30 ENCOUNTER — Ambulatory Visit
Admission: RE | Admit: 2013-04-30 | Discharge: 2013-04-30 | Disposition: A | Discharge status: Home / Self Care | Payer: Medicare Other | Source: Ambulatory Visit | Attending: Radiation Oncology | Admitting: Radiation Oncology

## 2013-04-30 ENCOUNTER — Encounter: Payer: Self-pay | Admitting: Radiation Oncology

## 2013-04-30 VITALS — BP 151/84 | HR 113 | Temp 98.3°F | Resp 20 | Wt 287.4 lb

## 2013-04-30 DIAGNOSIS — C770 Secondary and unspecified malignant neoplasm of lymph nodes of head, face and neck: Secondary | ICD-10-CM

## 2013-04-30 NOTE — Progress Notes (Signed)
Weekly Management Note:  Site: Left neck/clavicular region Current Dose:  2250  cGy Projected Dose: 3500  cGy  Narrative: The patient is seen today for routine under treatment assessment. CBCT/MVCT images/port films were reviewed. The chart was reviewed.   No complaints today. He denies dysphagia or difficulty breathing. Physical Examination:  Filed Vitals:   04/30/13 1000  BP: 151/84  Pulse: 113  Temp: 98.3 F (36.8 C)  Resp: 20  .  Weight: 287 lb 6.4 oz (130.364 kg). Other that his mass is slightly smaller and softer. Mild erythema the skin.  Impression: Tolerating radiation therapy well. He'll finish his treatment in one week.  Plan: Continue radiation therapy as planned.

## 2013-04-30 NOTE — Progress Notes (Signed)
Pt denies pain, fatigue, loss of appetite. He states he feels the area on his left supraclavicular region "is going down."

## 2013-05-01 ENCOUNTER — Ambulatory Visit
Admission: RE | Admit: 2013-05-01 | Discharge: 2013-05-01 | Disposition: A | Discharge status: Home / Self Care | Payer: Medicare Other | Source: Ambulatory Visit | Attending: Radiation Oncology | Admitting: Radiation Oncology

## 2013-05-02 ENCOUNTER — Ambulatory Visit
Admission: RE | Admit: 2013-05-02 | Discharge: 2013-05-02 | Disposition: A | Discharge status: Home / Self Care | Payer: Medicare Other | Source: Ambulatory Visit | Attending: Radiation Oncology | Admitting: Radiation Oncology

## 2013-05-03 ENCOUNTER — Ambulatory Visit
Admission: RE | Admit: 2013-05-03 | Discharge: 2013-05-03 | Disposition: A | Discharge status: Home / Self Care | Payer: Medicare Other | Source: Ambulatory Visit | Attending: Radiation Oncology | Admitting: Radiation Oncology

## 2013-05-04 ENCOUNTER — Ambulatory Visit
Admission: RE | Admit: 2013-05-04 | Discharge: 2013-05-04 | Disposition: A | Discharge status: Home / Self Care | Payer: Medicare Other | Source: Ambulatory Visit | Attending: Radiation Oncology | Admitting: Radiation Oncology

## 2013-05-07 ENCOUNTER — Ambulatory Visit
Admission: RE | Admit: 2013-05-07 | Discharge: 2013-05-07 | Disposition: A | Discharge status: Home / Self Care | Payer: Medicare Other | Source: Ambulatory Visit | Attending: Radiation Oncology | Admitting: Radiation Oncology

## 2013-05-07 ENCOUNTER — Encounter: Payer: Self-pay | Admitting: Radiation Oncology

## 2013-05-07 VITALS — BP 129/52 | HR 110 | Temp 97.6°F | Resp 20 | Wt 285.5 lb

## 2013-05-07 DIAGNOSIS — C770 Secondary and unspecified malignant neoplasm of lymph nodes of head, face and neck: Secondary | ICD-10-CM

## 2013-05-07 NOTE — Progress Notes (Signed)
Pt denies pain, loss of appetite. He states he is fatigued due to medications as well as radiation treatments. Pt 's skin of left neck dark pink. He states his throat has been sore x 3-4 days, so he has modified his diet.  Pt completed treatments today, gave him 1 month FU card.

## 2013-05-07 NOTE — Progress Notes (Signed)
Weekly Management Note:  Site: Left neck/clavicular region Current Dose:  3500  cGy Projected Dose: 3500  cGy  Narrative: The patient is seen today for routine under treatment assessment. CBCT/MVCT images/port films were reviewed. The chart was reviewed.   His treatment is completed today. He is without complaints except for mild sore throat of the past 3-4 days, presumably from radiation esophagitis.  Physical Examination:  Filed Vitals:   05/07/13 0954  BP: 129/52  Pulse: 110  Temp: 97.6 F (36.4 C)  Resp: 20  .  Weight: 285 lb 8 oz (129.502 kg). There is mild erythema along the left neck with further softening a regression of his palpable disease.  Impression: Radiation therapy well tolerated. He does have mild esophagitis. This should improve within the next few days.  Plan: Follow visit in one month.

## 2013-05-07 NOTE — Progress Notes (Signed)
[facility] End of Treatment Note  Name:Donald Blair  Date: 05/07/2013 LYY:[id number] DOB:[birth year]   Status:outpatient    CC: [name], MD  Dr. Zola Button  REFERRING PHYSICIAN:  Dr. 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 He had mild discomfort on swallowing during his last week of therapy. We can expect further tumor regression over the next 2 months.                           PLAN: Routine followup in one month. Patient instructed to call if questions or worsening complaints in interim.

## 2013-05-13 ENCOUNTER — Ambulatory Visit (INDEPENDENT_AMBULATORY_CARE_PROVIDER_SITE_OTHER): Payer: Medicare Other | Admitting: Emergency Medicine

## 2013-05-13 VITALS — BP 150/74 | HR 112 | Temp 98.2°F | Resp 18 | Ht 70.0 in | Wt 284.4 lb

## 2013-05-13 DIAGNOSIS — C61 Malignant neoplasm of prostate: Secondary | ICD-10-CM

## 2013-05-13 DIAGNOSIS — K5900 Constipation, unspecified: Secondary | ICD-10-CM

## 2013-05-13 MED ORDER — HYDROCODONE-ACETAMINOPHEN 5-325 MG PO TABS: 1.0000 | ORAL_TABLET | ORAL | Status: DC | PRN

## 2013-05-13 MED ORDER — POLYETHYLENE GLYCOL 3350 17 GM/SCOOP PO POWD: 17.0000 g | Freq: Every day | ORAL | Status: DC

## 2013-05-13 NOTE — Patient Instructions (Signed)

## 2013-05-13 NOTE — Progress Notes (Signed)
[facility] [address], [place] [address] [phone]  Date:  05/13/2013   Name:  Donald Blair   DOB:  02-16-1941   MRN:  [id number]  PCP:  [name], MD    Chief Complaint: Constipation   History of Present Illness:  Donald Blair is a 73 y.o. very pleasant male patient who presents with the following:  Patient has a history of metastatic prostate cancer with mets to bone and soft tissue of the neck.  Currently undergoing RT to mets in neck.  To office with pain in posterolateral lower right chest wall. Has no cough, fever, chills, or wheezing. No history of injury or overuse.  No nausea or vomiting.  No stool change.  No dysuria, urgency or frequency.  Has constipation and has not moved his bowels in 2-3 days. Denies frequent constipation. No blood in urine or stools, no melena.  Appetite is stable.  Swelling in legs he says is worse.  No improvement with over the counter medications or other home remedies. Denies other complaint or health concern today.   Says not on medication for pain despite bone mets  Patient Active Problem List   Diagnosis Date Noted  . Secondary and unspecified malignant neoplasm of lymph nodes of head, face, and neck   . Cancer   . Metastasis to bone   . Prostate cancer 12/11/2012  . Bone metastasis 12/11/2012    Past Medical History  Diagnosis Date  . Cancer     prostate  . Hyperlipidemia   . Wears glasses   . Shortness of breath   . Arthritis   . 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 Lymphadenopathy, supraclavicular     left, prostate primary  . Prostate cancer 2005    xrt w/seed implant    Past Surgical History  Procedure Laterality Date  . Tonsillectomy    . Hernia repair  1972    lt ing  . Knee arthroscopy  1999    right  . Knee arthroscopy  2001    left  . Transperineal implant of radiation seeds w/ ultrasound  2005    prostate  . Shoulder arthroscopy with rotator cuff  repair Right 10/03/2012    Procedure: RIGHT SHOULDER ARTHROSCOPY WITH OPEN ROTATOR CUFF REPAIR AND DISTAL CLAVICLE EXCISION;  Surgeon: Cammie Sickle., MD;  Location: [facility];  Service: Orthopedics;  Laterality: Right;    History  Substance Use Topics  . Smoking status: Former Research scientist (life sciences)  . Smokeless tobacco: Former Systems developer    Quit date: 09/27/1972  . Alcohol Use: No    History reviewed. No pertinent family history.  No Known Allergies  Medication list has been reviewed and updated.  Current Outpatient Prescriptions on File Prior to Visit  Medication Sig Dispense Refill  . aspirin 81 MG tablet Take 81 mg by mouth daily.      . calcium citrate-vitamin D (CITRACAL+D) 315-200 MG-UNIT per tablet Take 1 tablet by mouth 2 (two) times daily.      . enzalutamide (XTANDI) 40 MG capsule Take 4 capsules (160 mg total) by mouth daily.  120 capsule  0  . fish oil-omega-3 fatty acids 1000 MG capsule Take 2 g by mouth daily.      . furosemide (LASIX) 20 MG tablet Take 1 tablet (20 mg total) by mouth 2 (two) times daily.  60 tablet  2  . LORazepam (ATIVAN) 1 MG tablet  Take one by mouth 30 minutes before daily treatment  30 tablet  0  . Multiple Vitamins-Minerals (MULTIVITAMIN WITH MINERALS) tablet Take 1 tablet by mouth daily.      . naproxen sodium (ANAPROX) 220 MG tablet Take 220 mg by mouth 2 (two) times daily with a meal.      . ondansetron (ZOFRAN) 8 MG tablet Take 1 tablet (8 mg total) by mouth every 12 (twelve) hours as needed for nausea.  30 tablet  1  . potassium chloride SA (K-DUR,KLOR-CON) 20 MEQ tablet Take 1 tablet (20 mEq total) by mouth daily.  30 tablet  1  . pravastatin (PRAVACHOL) 40 MG tablet Take 40 mg by mouth daily.       No current facility-administered medications on file prior to visit.    Review of Systems:  As per HPI, otherwise negative.    Physical Examination: Filed Vitals:   05/13/13 1027  BP: 150/74  Pulse: 112  Temp: 98.2 F (36.8 C)  Resp:  18   Filed Vitals:   05/13/13 1027  Height: 5\' 10"  (1.778 m)  Weight: 284 lb 6 oz (128.992 kg)   Body mass index is 40.8 kg/(m^2). Ideal Body Weight: Weight in (lb) to have BMI = 25: 173.9  GEN: obese, NAD, Non-toxic, A & O x 3 HEENT: Atraumatic, Normocephalic. Neck supple. No masses, No LAD. Ears and Nose: No external deformity. CV: RRR, No M/G/R. No JVD. No thrill. No extra heart sounds. PULM: CTA B, no wheezes, crackles, rhonchi. No retractions. No resp. distress. No accessory muscle use. ABD: S, NT, ND, +BS. No rebound. No HSM. EXTR: No c/c.  Moderate peripheral edema NEURO Normal gait.  PSYCH: Normally interactive. Conversant. Not depressed or anxious appearing.  Calm demeanor.    Assessment and Plan: Metastatic prostate cancer New bone pain in right chest wall. vicodin Constipation miralax  Signed,  Ellison Carwin, MD

## 2013-05-30 ENCOUNTER — Encounter: Payer: Self-pay | Admitting: *Deleted

## 2013-06-01 ENCOUNTER — Other Ambulatory Visit: Payer: Self-pay | Admitting: Oncology

## 2013-06-01 DIAGNOSIS — C61 Malignant neoplasm of prostate: Secondary | ICD-10-CM

## 2013-06-01 MED ORDER — ENZALUTAMIDE 40 MG PO CAPS: 160.0000 mg | ORAL_CAPSULE | Freq: Every day | ORAL | Status: DC

## 2013-06-04 ENCOUNTER — Encounter: Payer: Self-pay | Admitting: Oncology

## 2013-06-04 NOTE — Telephone Encounter (Signed)
Message read at this time.  Called spoke with Bethena Roys and instructed to go to ED due to diarrhea since 05-30-2013.  Asked if he should take imodium and at this point informed her he needs evaluation.   How may stools per day?  "He is going a bunch." Is he able to eat and drink?  "He is not able to eat but trying to drink.  His bottom is real red and raw." Where is the blood?  "It's on the toilet tissue." Does he have hemorrhoids?  "I don't know?"    Instructed again to go to ED.

## 2013-06-06 ENCOUNTER — Ambulatory Visit (HOSPITAL_BASED_OUTPATIENT_CLINIC_OR_DEPARTMENT_OTHER): Payer: Medicare Other | Admitting: Oncology

## 2013-06-06 ENCOUNTER — Encounter: Payer: Self-pay | Admitting: Oncology

## 2013-06-06 ENCOUNTER — Encounter: Payer: Self-pay | Admitting: *Deleted

## 2013-06-06 ENCOUNTER — Ambulatory Visit: Payer: Self-pay

## 2013-06-06 ENCOUNTER — Emergency Department (HOSPITAL_COMMUNITY): Payer: Medicare Other

## 2013-06-06 ENCOUNTER — Emergency Department (HOSPITAL_COMMUNITY)
Admission: EM | Admit: 2013-06-06 | Discharge: 2013-06-06 | Disposition: A | Discharge status: Home / Self Care | Payer: Medicare Other | Attending: Emergency Medicine | Admitting: Emergency Medicine

## 2013-06-06 ENCOUNTER — Ambulatory Visit: Admission: RE | Admit: 2013-06-06 | Payer: Medicare Other | Source: Ambulatory Visit | Admitting: Radiation Oncology

## 2013-06-06 ENCOUNTER — Other Ambulatory Visit (HOSPITAL_BASED_OUTPATIENT_CLINIC_OR_DEPARTMENT_OTHER): Payer: Medicare Other

## 2013-06-06 ENCOUNTER — Encounter (HOSPITAL_COMMUNITY): Payer: Self-pay | Admitting: Emergency Medicine

## 2013-06-06 ENCOUNTER — Telehealth: Payer: Self-pay | Admitting: Oncology

## 2013-06-06 VITALS — BP 134/62 | HR 124 | Temp 97.4°F | Resp 20 | Ht 70.0 in | Wt 296.9 lb

## 2013-06-06 DIAGNOSIS — M79609 Pain in unspecified limb: Secondary | ICD-10-CM

## 2013-06-06 DIAGNOSIS — Z87891 Personal history of nicotine dependence: Secondary | ICD-10-CM | POA: Insufficient documentation

## 2013-06-06 DIAGNOSIS — R599 Enlarged lymph nodes, unspecified: Secondary | ICD-10-CM

## 2013-06-06 DIAGNOSIS — Z789 Other specified health status: Secondary | ICD-10-CM | POA: Insufficient documentation

## 2013-06-06 DIAGNOSIS — R627 Adult failure to thrive: Secondary | ICD-10-CM

## 2013-06-06 DIAGNOSIS — K5900 Constipation, unspecified: Secondary | ICD-10-CM | POA: Insufficient documentation

## 2013-06-06 DIAGNOSIS — Z85830 Personal history of malignant neoplasm of bone: Secondary | ICD-10-CM | POA: Insufficient documentation

## 2013-06-06 DIAGNOSIS — Z8546 Personal history of malignant neoplasm of prostate: Secondary | ICD-10-CM | POA: Insufficient documentation

## 2013-06-06 DIAGNOSIS — R609 Edema, unspecified: Secondary | ICD-10-CM

## 2013-06-06 DIAGNOSIS — C7951 Secondary malignant neoplasm of bone: Secondary | ICD-10-CM

## 2013-06-06 DIAGNOSIS — R7989 Other specified abnormal findings of blood chemistry: Secondary | ICD-10-CM | POA: Insufficient documentation

## 2013-06-06 DIAGNOSIS — R0602 Shortness of breath: Secondary | ICD-10-CM | POA: Insufficient documentation

## 2013-06-06 DIAGNOSIS — R0682 Tachypnea, not elsewhere classified: Secondary | ICD-10-CM | POA: Insufficient documentation

## 2013-06-06 DIAGNOSIS — C7952 Secondary malignant neoplasm of bone marrow: Secondary | ICD-10-CM

## 2013-06-06 DIAGNOSIS — R197 Diarrhea, unspecified: Secondary | ICD-10-CM

## 2013-06-06 DIAGNOSIS — M129 Arthropathy, unspecified: Secondary | ICD-10-CM | POA: Insufficient documentation

## 2013-06-06 DIAGNOSIS — R5383 Other fatigue: Secondary | ICD-10-CM

## 2013-06-06 DIAGNOSIS — C801 Malignant (primary) neoplasm, unspecified: Secondary | ICD-10-CM

## 2013-06-06 DIAGNOSIS — C61 Malignant neoplasm of prostate: Secondary | ICD-10-CM

## 2013-06-06 DIAGNOSIS — E291 Testicular hypofunction: Secondary | ICD-10-CM

## 2013-06-06 DIAGNOSIS — R000 Tachycardia, unspecified: Secondary | ICD-10-CM

## 2013-06-06 DIAGNOSIS — Z791 Long term (current) use of non-steroidal anti-inflammatories (NSAID): Secondary | ICD-10-CM | POA: Insufficient documentation

## 2013-06-06 DIAGNOSIS — Z862 Personal history of diseases of the blood and blood-forming organs and certain disorders involving the immune mechanism: Secondary | ICD-10-CM | POA: Insufficient documentation

## 2013-06-06 DIAGNOSIS — Z79899 Other long term (current) drug therapy: Secondary | ICD-10-CM | POA: Insufficient documentation

## 2013-06-06 DIAGNOSIS — Z8639 Personal history of other endocrine, nutritional and metabolic disease: Secondary | ICD-10-CM | POA: Insufficient documentation

## 2013-06-06 DIAGNOSIS — R945 Abnormal results of liver function studies: Secondary | ICD-10-CM

## 2013-06-06 DIAGNOSIS — Z923 Personal history of irradiation: Secondary | ICD-10-CM | POA: Insufficient documentation

## 2013-06-06 DIAGNOSIS — M7989 Other specified soft tissue disorders: Secondary | ICD-10-CM

## 2013-06-06 DIAGNOSIS — R5381 Other malaise: Secondary | ICD-10-CM | POA: Insufficient documentation

## 2013-06-06 DIAGNOSIS — Z96659 Presence of unspecified artificial knee joint: Secondary | ICD-10-CM | POA: Insufficient documentation

## 2013-06-06 LAB — CBC WITH DIFFERENTIAL/PLATELET
BASO%: 0.4 % (ref 0.0–2.0)
Basophils Absolute: 0 10*3/uL (ref 0.0–0.1)
EOS ABS: 0.1 10*3/uL (ref 0.0–0.5)
EOS%: 0.6 % (ref 0.0–7.0)
HCT: 41.6 % (ref 38.4–49.9)
HGB: 13.9 g/dL (ref 13.0–17.1)
LYMPH#: 0.7 10*3/uL — AB (ref 0.9–3.3)
LYMPH%: 6.1 % — ABNORMAL LOW (ref 14.0–49.0)
MCH: 29 pg (ref 27.2–33.4)
MCHC: 33.4 g/dL (ref 32.0–36.0)
MCV: 87 fL (ref 79.3–98.0)
MONO#: 1.1 10*3/uL — ABNORMAL HIGH (ref 0.1–0.9)
MONO%: 9.2 % (ref 0.0–14.0)
NEUT%: 83.7 % — ABNORMAL HIGH (ref 39.0–75.0)
NEUTROS ABS: 9.9 10*3/uL — AB (ref 1.5–6.5)
PLATELETS: 236 10*3/uL (ref 140–400)
RBC: 4.79 10*6/uL (ref 4.20–5.82)
RDW: 20.3 % — AB (ref 11.0–14.6)
WBC: 11.8 10*3/uL — ABNORMAL HIGH (ref 4.0–10.3)

## 2013-06-06 LAB — URINALYSIS, ROUTINE W REFLEX MICROSCOPIC
Bilirubin Urine: NEGATIVE
GLUCOSE, UA: NEGATIVE mg/dL
HGB URINE DIPSTICK: NEGATIVE
Ketones, ur: NEGATIVE mg/dL
Leukocytes, UA: NEGATIVE
Nitrite: NEGATIVE
Protein, ur: NEGATIVE mg/dL
SPECIFIC GRAVITY, URINE: 1.029 (ref 1.005–1.030)
UROBILINOGEN UA: 1 mg/dL (ref 0.0–1.0)
pH: 6 (ref 5.0–8.0)

## 2013-06-06 LAB — COMPREHENSIVE METABOLIC PANEL (CC13)
ALT: 75 U/L — AB (ref 0–55)
ANION GAP: 12 meq/L — AB (ref 3–11)
AST: 172 U/L (ref 5–34)
Albumin: 2.3 g/dL — ABNORMAL LOW (ref 3.5–5.0)
Alkaline Phosphatase: 769 U/L — ABNORMAL HIGH (ref 40–150)
BUN: 14.5 mg/dL (ref 7.0–26.0)
CHLORIDE: 100 meq/L (ref 98–109)
CO2: 20 meq/L — AB (ref 22–29)
Calcium: 8 mg/dL — ABNORMAL LOW (ref 8.4–10.4)
Creatinine: 0.8 mg/dL (ref 0.7–1.3)
GLUCOSE: 128 mg/dL (ref 70–140)
POTASSIUM: 3.5 meq/L (ref 3.5–5.1)
SODIUM: 132 meq/L — AB (ref 136–145)
TOTAL PROTEIN: 6.1 g/dL — AB (ref 6.4–8.3)
Total Bilirubin: 3 mg/dL — ABNORMAL HIGH (ref 0.20–1.20)

## 2013-06-06 LAB — POC OCCULT BLOOD, ED: FECAL OCCULT BLD: POSITIVE — AB

## 2013-06-06 LAB — PRO B NATRIURETIC PEPTIDE: Pro B Natriuretic peptide (BNP): 69.6 pg/mL (ref 0–125)

## 2013-06-06 LAB — TROPONIN I: Troponin I: <0.3 ng/mL (ref <0.30)

## 2013-06-06 MED ORDER — POTASSIUM CHLORIDE CRYS ER 20 MEQ PO TBCR: EXTENDED_RELEASE_TABLET | ORAL | Status: DC

## 2013-06-06 MED ORDER — DIPHENOXYLATE-ATROPINE 2.5-0.025 MG PO TABS: 1.0000 | ORAL_TABLET | Freq: Four times a day (QID) | ORAL | Status: DC | PRN

## 2013-06-06 MED ORDER — IOHEXOL 300 MG/ML  SOLN
50.0000 mL | Freq: Once | INTRAMUSCULAR | Status: AC | PRN
  Administered 2013-06-06: 50 mL via ORAL

## 2013-06-06 MED ORDER — IOHEXOL 350 MG/ML SOLN
100.0000 mL | Freq: Once | INTRAVENOUS | Status: AC | PRN
  Administered 2013-06-06: 100 mL via INTRAVENOUS

## 2013-06-06 MED ORDER — LORAZEPAM 2 MG/ML IJ SOLN
0.5000 mg | Freq: Once | INTRAMUSCULAR | Status: AC
  Administered 2013-06-06: 0.5 mg via INTRAVENOUS
  Filled 2013-06-06: qty 1

## 2013-06-06 MED ORDER — DENOSUMAB 120 MG/1.7ML ~~LOC~~ SOLN
120.0000 mg | Freq: Once | SUBCUTANEOUS | Status: DC
  Filled 2013-06-06: qty 1.7

## 2013-06-06 MED ORDER — FUROSEMIDE 40 MG PO TABS: 40.0000 mg | ORAL_TABLET | Freq: Every day | ORAL | Status: DC | PRN

## 2013-06-06 NOTE — ED Notes (Signed)
Lab called stating they did not receive blood sample for troponin. Informed lab I sent down green top and labels. Lab will call back to determine whether it needs to be redrawn.

## 2013-06-06 NOTE — ED Notes (Signed)
Pt from Cancer center, c/o diarrhea x 1 week no vomiting. Pt denies pain.

## 2013-06-06 NOTE — ED Notes (Signed)
Pt insisted on sitting on side of bed.

## 2013-06-06 NOTE — ED Notes (Signed)
Pt made aware of need for urine specimen pt attempted to void but was unsuccessful

## 2013-06-06 NOTE — ED Notes (Signed)
Bed: WA20 Expected date:  Expected time:  Means of arrival:  Comments: [place]

## 2013-06-06 NOTE — Progress Notes (Signed)
Hematology and Oncology Follow Up Visit  LISA RASK [id number] 09-16-1940 73 y.o. 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 His initial diagnosis was in 2005 with a Gleason score 3+4 equals 7 and a PSA of 7.  Prior Therapy:  1. He underwent external beam radiation with seed implants completed in July 2005. 2. He developed PSA recurrence in July 2010 but his prostate biopsy was negative at that time. 3. He started androgen deprivation February 2011 due to a rapid doubling of his PSA. 4. He received intermittent androgen deprivation in February 2012 until March 2013 her he developed a rise in his PSA indicating castration resistant disease. 5. He was treated as part of the STRIVT trial in May of 2013 after developing measurable disease with retroperitoneal lymphadenopathy and bone metastasis. 6. In January 2014 he developed further progression of his disease and was treated with Provenge immune therapy in April 2014. 7. Zytiga 1000 mg daily 11/14/12 through 03/27/13. Stopped due to rising PSA and edema.  Current therapy:  1. Xtandi 160 mg daily started on 03/2013 2. He receives Lupron at [facility] urology. Plans to begin receiving this here in April 2015. 3. He receives Niger monthly at [facility] urology. This will be given at the [place] starting on 04/27/2013.  Interim History:  Mr. Cazes is seen for routine followup today with his wife. He is a nice gentleman with the above diagnosis. Since his last visit, he have reported about one week duration of fatigue and tiredness. He also developed worsening diarrhea and failure to thrive. He reported tachycardia and shortness of breath. He was constipated and was evaluated by his primary care physician and was started on laxatives and since then have felt poorly. Has not  reported any fevers or chills or sweats. Does not report any productive cough. He continues on Xtandi and havetolerating well. He denies chest paint, abdominal pain. He has dyspnea on exertion which is worse from his baseline. No hematuria or other bleeding noted. Denies back, hip, and shoulder pain. Appetite is poor at this time and his by mouth intake has declined recently.  Medications: I have reviewed the patient's current medications.  Current Outpatient Prescriptions  Medication Sig Dispense Refill  . calcium citrate-vitamin D (CITRACAL+D) 315-200 MG-UNIT per tablet Take 1 tablet by mouth 2 (two) times daily.      . enzalutamide (XTANDI) 40 MG capsule Take 4 capsules (160 mg total) by mouth daily.  120 capsule  0  . furosemide (LASIX) 20 MG tablet Take 1 tablet (20 mg total) by mouth 2 (two) times daily.  60 tablet  2  . HYDROcodone-acetaminophen (NORCO) 5-325 MG per tablet Take 1-2 tablets by mouth every 4 (four) hours as needed for moderate pain.  30 tablet  0  . Multiple Vitamins-Minerals (MULTIVITAMIN WITH MINERALS) tablet Take 1 tablet by mouth daily.      . naproxen sodium (ANAPROX) 220 MG tablet Take 220 mg by mouth 2 (two) times daily with a meal.      . ondansetron (ZOFRAN) 8 MG tablet Take 1 tablet (8 mg total) by mouth every 12 (twelve) hours as needed for nausea.  30 tablet  1  . polyethylene glycol powder (GLYCOLAX/MIRALAX) powder Take 17 g by mouth daily.  3350 g  1  . potassium chloride SA (K-DUR,KLOR-CON) 20 MEQ tablet Take 1 tablet (20  mEq total) by mouth daily.  30 tablet  1   No current facility-administered medications for this visit.   Facility-Administered Medications Ordered in Other Visits  Medication Dose Route Frequency Provider Last Rate Last Dose  . denosumab (XGEVA) injection 120 mg  120 mg Subcutaneous Once Wyatt Portela, MD         Allergies: No Known Allergies  Past Medical History, Surgical history, Social history, and Family History were reviewed and  updated.  Review of Systems:  Remaining ROS negative.  Physical Exam: Blood pressure 134/62, pulse 124, temperature 97.4 F (36.3 C), temperature source Oral, resp. rate 20, height 5\' 10"  (1.778 m), weight 296 lb 14.4 oz (134.673 kg), SpO2 96.00%. ECOG: 1 General appearance: alert, fatigued and mild distress Head: Normocephalic, without obvious abnormality, atraumatic Neck: no adenopathy, no carotid bruit, no JVD, supple, symmetrical, trachea midline and thyroid not enlarged, symmetric, no tenderness/mass/nodules Lymph nodes: Cervical adenopathy: negative, Axillary adenopathy: negative and Supraclavicular adenopathy: left supraclaviclar node approximately 3.5 cm softer and getting smaller. Heart:regular rate and rhythm, S1, S2 normal, no murmur, click, rub or gallop. Tachycardic. Lung:chest clear, no wheezing, rales, normal symmetric air entry, no tachypnea, retractions or cyanosis Abdomen: soft, non-tender, without masses or organomegaly EXT:no erythema, induration, or nodules. 1+ edema bilaterally.   Lab Results: Lab Results  Component Value Date   WBC 11.8* 06/06/2013   HGB 13.9 06/06/2013   HCT 41.6 06/06/2013   MCV 87.0 06/06/2013   PLT 236 06/06/2013     Chemistry      Component Value Date/Time   NA 138 04/27/2013 1010   NA 136 02/09/2013 1520   K 4.6 04/27/2013 1010   K 3.8 02/09/2013 1520   CL 101 02/09/2013 1520   CO2 27 04/27/2013 1010   CO2 27 02/09/2013 1520   BUN 17.5 04/27/2013 1010   BUN 14 02/09/2013 1520   CREATININE 0.8 04/27/2013 1010   CREATININE 1.04 02/09/2013 1520      Component Value Date/Time   CALCIUM 9.2 04/27/2013 1010   CALCIUM 8.6 02/09/2013 1520   ALKPHOS 400* 04/27/2013 1010   ALKPHOS 218* 02/09/2013 1520   AST 48* 04/27/2013 1010   AST 38* 02/09/2013 1520   ALT 31 04/27/2013 1010   ALT 30 02/09/2013 1520   BILITOT 0.64 04/27/2013 1010   BILITOT 0.7 02/09/2013 1520      Results for BOBIE, KISTLER (MRN [id number]) as of 06/06/2013 10:41   Ref. Range 03/27/2013 08:39 04/27/2013 10:11  PSA Latest Range: <=4.00 ng/mL 237.80 (H) 530.60 (H)     Impression and Plan:  This is a 73 year old gentleman with the following issues: 1. Castration resistant prostate cancer. He is currently on Zytiga. PSA has dropped from 140 down to 104 after approximately 3 weeks of therapy but now it's back up again to 530 on 04/27/2013. I fear that he is progressing on Xtandi and will require systemic chemotherapy. Unfortunately he is not really in shape to take any chemotherapy for the time being so we'll hold off on any treatment until this current episode improves at least for the time being. I will evaluate him in about 2 weeks to assess his performance status and make a decision regarding systemic chemotherapy. If his functional decline is related to worsening cancer and I think his options of treatment will be limited at this time.  2. Failure to thrive, and diarrhea and tachycardia: Unclear etiology it could be related to cancer progression but it seems to be rather acute  in the last week. This could be related to gastroenteritis possible bronchitis or pneumonia. I will send him to the emergency department for an evaluation if he continues to decline rather rapidly. Once these issues resolve, we will address his cancer care.   2. Androgen deprivation. He will continue on Lupron. He is due the next visit  3. Bone directed therapy. He will continue on Xgeva with the next visit.  4. Nausea. Better since stopping Zytiga. He has Zofran at home.  5. Lower extremity edema. Seems to have gotten worse as of late.  6. Enlarging left supraclavicular lymph node: Improved with radiation.  7. Followup. He will return in approximately 2 weeks.     Marck Mcclenny 3/11/201510:52 AM

## 2013-06-06 NOTE — ED Notes (Signed)
Pt attempted to provide urine specimen.  Pt states he spilled his urinal. Bed changed

## 2013-06-06 NOTE — Telephone Encounter (Signed)
gv and printed appt sched anda vs for pt for March.... °

## 2013-06-06 NOTE — Progress Notes (Signed)
Bilateral lower extremity venous duplex:  No evidence of DVT, superficial thrombosis, or Baker's Cyst.   

## 2013-06-06 NOTE — ED Notes (Signed)
Pt attempted to urinate with no success.

## 2013-06-06 NOTE — ED Provider Notes (Signed)
CSN: [id number]     Arrival date & time 06/06/13  1107 History   First MD Initiated Contact with Patient 06/06/13 1120     Chief Complaint  Patient presents with  . Diarrhea  . Leg Swelling  . Weakness     (Consider location/radiation/quality/duration/timing/severity/associated sxs/prior Treatment) HPI Patient has metastatic prostate cancer. He was seen today by his oncologist and sent to the ER. He reports he has had diarrhea for the past week. He only had 2 episodes today, when asked how much he had before he states "a whole bunch". He denies nausea, vomiting, or abdominal pain. He did state he saw some blood in his bowel movements once. Patient states he has been getting weak and today he was too weak to use his walker. He denies feeling dizzy or lightheaded. He states he's had chronic shortness of breath for years which he states is not worse however his wife states he thinks it is. They deny fever. Patient reportedly had constipation and today before he started having diarrhea he took one dose of MiraLAX. He has not been on any antibiotics. He has had swelling in his legs since July that was felt to be from his cancer medication. He denies any chest pain and has had a mild cough.  PCP Dr Adelene Amas Oncology Dr Clelia Croft  Past Medical History  Diagnosis Date  . Cancer     prostate  . Hyperlipidemia   . Wears glasses   . Shortness of breath   . Arthritis   . 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 Lymphadenopathy, supraclavicular     left, prostate primary  . Prostate cancer 2005    xrt w/seed implant  . Hx of radiation therapy 04/18/13- 05/07/13    left neck/upper mediastinum 3500 cGy in 14 sessions   Past Surgical History  Procedure Laterality Date  . Tonsillectomy    . Hernia repair  1972    lt ing  . Knee arthroscopy  1999    right  . Knee arthroscopy  2001    left  . Transperineal implant of radiation seeds w/ ultrasound  2005    prostate  . Shoulder  arthroscopy with rotator cuff repair Right 10/03/2012    Procedure: RIGHT SHOULDER ARTHROSCOPY WITH OPEN ROTATOR CUFF REPAIR AND DISTAL CLAVICLE EXCISION;  Surgeon: Wyn Forster., MD;  Location: [facility];  Service: Orthopedics;  Laterality: Right;   No family history on file. History  Substance Use Topics  . Smoking status: Former Games developer  . Smokeless tobacco: Former Neurosurgeon    Quit date: 09/27/1972  . Alcohol Use: No  lives at home Lives with spouse Uses a walker  Review of Systems  All other systems reviewed and are negative.      Allergies  Review of patient's allergies indicates no known allergies.  Home Medications   Current Outpatient Rx  Name  Route  Sig  Dispense  Refill  . calcium citrate-vitamin D (CITRACAL+D) 315-200 MG-UNIT per tablet   Oral   Take 1 tablet by mouth 2 (two) times daily.         . enzalutamide (XTANDI) 40 MG capsule   Oral   Take 4 capsules (160 mg total) by mouth daily.   120 capsule   0   . furosemide (LASIX) 20 MG tablet   Oral   Take 1 tablet (20 mg total) by mouth 2 (two) times daily.   60 tablet  2   . Multiple Vitamins-Minerals (MULTIVITAMIN WITH MINERALS) tablet   Oral   Take 1 tablet by mouth daily.         . naproxen sodium (ANAPROX) 220 MG tablet   Oral   Take 220 mg by mouth 2 (two) times daily with a meal.         . ondansetron (ZOFRAN) 8 MG tablet   Oral   Take 1 tablet (8 mg total) by mouth every 12 (twelve) hours as needed for nausea.   30 tablet   1   . polyethylene glycol powder (GLYCOLAX/MIRALAX) powder   Oral   Take 17 g by mouth daily.   3350 g   1   . potassium chloride SA (K-DUR,KLOR-CON) 20 MEQ tablet   Oral   Take 1 tablet (20 mEq total) by mouth daily.   30 tablet   1    BP 152/100  Pulse 129  Temp(Src) 97.6 F (36.4 C) (Oral)  Resp 27  SpO2 97%  Vital signs normal except tachycardia  Physical Exam  Nursing note and vitals reviewed. Constitutional: He is  oriented to person, place, and time. He appears well-developed and well-nourished.  Non-toxic appearance. He does not appear ill. No distress.  HENT:  Head: Normocephalic and atraumatic.  Right Ear: External ear normal.  Left Ear: External ear normal.  Nose: Nose normal. No mucosal edema or rhinorrhea.  Mouth/Throat: Oropharynx is clear and moist and mucous membranes are normal. No dental abscesses or uvula swelling.  Eyes: Conjunctivae and EOM are normal. Pupils are equal, round, and reactive to light.  Neck: Normal range of motion and full passive range of motion without pain. Neck supple.  Cardiovascular: Normal rate, regular rhythm and normal heart sounds.  Exam reveals no gallop and no friction rub.   No murmur heard. Pulmonary/Chest: Breath sounds normal. Tachypnea noted. No respiratory distress. He has no wheezes. He has no rhonchi. He has no rales. He exhibits no tenderness and no crepitus.  Abdominal: Soft. Normal appearance and bowel sounds are normal. He exhibits no distension. There is no tenderness. There is no rebound and no guarding.  Musculoskeletal: Normal range of motion. He exhibits no edema and no tenderness.  Patient has diffuse swelling of the bilateral lower extremities with some thickening of the skin. He has some mild diffuse redness of the skin without warmth. There are no open lesions.  Neurological: He is alert and oriented to person, place, and time. He has normal strength. No cranial nerve deficit.  Skin: Skin is warm, dry and intact. No rash noted. No erythema. No pallor.  Psychiatric: He has a normal mood and affect. His speech is normal and behavior is normal. His mood appears not anxious.    ED Course  Procedures (including critical care time)  Medications  LORazepam (ATIVAN) injection 0.5 mg (0.5 mg Intravenous Given 06/06/13 1236)  iohexol (OMNIPAQUE) 300 MG/ML solution 50 mL (50 mLs Oral Contrast Given 06/06/13 1259)  iohexol (OMNIPAQUE) 350 MG/ML  injection 100 mL (100 mLs Intravenous Contrast Given 06/06/13 1327)       Author: Charlaine Dalton, RVT Service: Vascular Lab Author Type: Cardiovascular Sonographer   Filed: 06/06/2013 2:44 PM Note Time: 06/06/2013 2:44 PM Status: Signed   Editor: Charlaine Dalton, RVT (Cardiovascular Sonographer)      Bilateral lower extremity venous duplex: No evidence of DVT, superficial thrombosis, or Baker's Cyst.   Pt and wife given test results, he feels ready to go home.  Labs Review  Results for orders placed during the hospital encounter of 06/06/13  PRO B NATRIURETIC PEPTIDE      Result Value Ref Range   Pro B Natriuretic peptide (BNP) 69.6  0 - 125 pg/mL  TROPONIN I      Result Value Ref Range   Troponin I <0.30  <0.30 ng/mL  URINALYSIS, ROUTINE W REFLEX MICROSCOPIC      Result Value Ref Range   Color, Urine YELLOW  YELLOW   APPearance CLEAR  CLEAR   Specific Gravity, Urine 1.029  1.005 - 1.030   pH 6.0  5.0 - 8.0   Glucose, UA NEGATIVE  NEGATIVE mg/dL   Hgb urine dipstick NEGATIVE  NEGATIVE   Bilirubin Urine NEGATIVE  NEGATIVE   Ketones, ur NEGATIVE  NEGATIVE mg/dL   Protein, ur NEGATIVE  NEGATIVE mg/dL   Urobilinogen, UA 1.0  0.0 - 1.0 mg/dL   Nitrite NEGATIVE  NEGATIVE   Leukocytes, UA NEGATIVE  NEGATIVE  POC OCCULT BLOOD, ED      Result Value Ref Range   Fecal Occult Bld POSITIVE (*) NEGATIVE   Laboratory interpretation all normal    Results for orders placed in visit on 06/06/13  CBC WITH DIFFERENTIAL      Result Value Ref Range   WBC 11.8 (*) 4.0 - 10.3 10e3/uL   NEUT# 9.9 (*) 1.5 - 6.5 10e3/uL   HGB 13.9  13.0 - 17.1 g/dL   HCT 41.6  38.4 - 49.9 %   Platelets 236  140 - 400 10e3/uL   MCV 87.0  79.3 - 98.0 fL   MCH 29.0  27.2 - 33.4 pg   MCHC 33.4  32.0 - 36.0 g/dL   RBC 4.79  4.20 - 5.82 10e6/uL   RDW 20.3 (*) 11.0 - 14.6 %   lymph# 0.7 (*) 0.9 - 3.3 10e3/uL   MONO# 1.1 (*) 0.1 - 0.9 10e3/uL   Eosinophils Absolute 0.1  0.0 - 0.5 10e3/uL   Basophils  Absolute 0.0  0.0 - 0.1 10e3/uL   NEUT% 83.7 (*) 39.0 - 75.0 %   LYMPH% 6.1 (*) 14.0 - 49.0 %   MONO% 9.2  0.0 - 14.0 %   EOS% 0.6  0.0 - 7.0 %   BASO% 0.4  0.0 - 2.0 %  COMPREHENSIVE METABOLIC PANEL (EU23)      Result Value Ref Range   Sodium 132 (*) 136 - 145 mEq/L   Potassium 3.5  3.5 - 5.1 mEq/L   Chloride 100  98 - 109 mEq/L   CO2 20 (*) 22 - 29 mEq/L   Glucose 128  70 - 140 mg/dl   BUN 14.5  7.0 - 26.0 mg/dL   Creatinine 0.8  0.7 - 1.3 mg/dL   Total Bilirubin 3.00 (*) 0.20 - 1.20 mg/dL   Alkaline Phosphatase 769 (*) 40 - 150 U/L   AST 172 Repeated and Verified (*) 5 - 34 U/L   ALT 75 (*) 0 - 55 U/L   Total Protein 6.1 (*) 6.4 - 8.3 g/dL   Albumin 2.3 (*) 3.5 - 5.0 g/dL   Calcium 8.0 (*) 8.4 - 10.4 mg/dL   Anion Gap 12 (*) 3 - 11 mEq/L   Laboratory interpretation all normal except marked increase in his LFT's, stable Hb    Imaging Review Ct Angio Chest W/cm &/or Wo Cm  Ct Abdomen Pelvis W Contrast  06/06/2013   CLINICAL DATA Increasing shortness of breath and leg swelling. Increasing liver function tests. Question  metastatic cancer. Question pulmonary embolism.  EXAM CT ANGIOGRAPHY CHEST  CT ABDOMEN AND PELVIS WITH CONTRAST  TECHNIQUE Multidetector CT imaging of the chest was performed using the standard protocol during bolus administration of intravenous contrast. Multiplanar CT image reconstructions and MIPs were obtained to evaluate the vascular anatomy. Multidetector CT imaging of the abdomen and pelvis was performed using the standard protocol during bolus administration of intravenous contrast.  CONTRAST 15mL OMNIPAQUE IOHEXOL 300 MG/ML SOLN, 164mL OMNIPAQUE IOHEXOL 350 MG/ML SOLN  COMPARISON 02/09/2013 chest CT.  11/14/2012 abdominal CT  FINDINGS CTA CHEST FINDINGS  THORACIC INLET/BODY WALL:  Irregularly-shaped mass in the left supraclavicular station is decreased in extent and bulk. The mass measures up to 4.3 cm in maximal dimension, previously 5.6 cm. Decreasing  encasement of the left subclavian artery and internal mammary origin, without narrowing of these vessels. Left axillary lymphadenopathy is new from prior, with the largest node measuring 23 x 18 mm. Bilateral thyroid nodules which are considered incidental due to size and clinical circumstances.  MEDIASTINUM:  No cardiomegaly. No pericardial effusion. Atherosclerosis of multiple arterial structures, including the coronary arteries. There is new enlargement of mediastinal nodes. AP window node currently measures 17 mm short axis. Lower right peritracheal node is rounded, 13 mm. Larger lower right periesophageal node, 2 cm in diameter.  LUNG WINDOWS:  Bibasilar atelectasis. No evidence for new pulmonary metastatic disease. A 6 mm pulmonary nodule in the lower right middle lobe is stable in size.  OSSEOUS:  Sclerotic metastases throughout the imaged skeleton. The gross distribution is stable from prior. There is no evidence of new extra osseous tumor spread or acute pathologic fracture.  CT ABDOMEN and PELVIS FINDINGS  ABDOMEN/PELVIS:  Liver: New hepatomegaly with patchy, geographic low-attenuation.  Biliary: Numerous gallstones within the collapsed gallbladder. No biliary ductal dilatation.  Pancreas: Unremarkable.  Spleen: Unremarkable.  Adrenals: Enlarging right adrenal mass, currently 3 x 2.1 cm.  Kidneys and ureters: Bilateral renal cysts. No evidence of solid mass. No hydronephrosis.  Bladder: Unremarkable.  Reproductive: Brachytherapy seeds within the prostate bed and surrounding soft tissues. No increasing tissue in this region.  Bowel: No obstruction. Normal appendix.  Retroperitoneum: Diffuse decrease in size of the preaortic and periaortic lymphadenopathy. There is increased retroperitoneal stranding in this region which likely reflects scarring. No narrowing of the aorta or ureters. Index node along the lower left periaortic station was previously 3 cm, currently 18 mm in short axis. There is mild interval  enlargement of nodes in the deep liver drainage, with a node just anterior to the hepatic artery measuring 3 cm x 1.9 cm, previously 12 x 19 mm.  Peritoneum: Small volume ascites, predominantly in the upper abdomen. The fluid is water density. No peritoneal nodularity.  Vascular: No acute abnormality.  OSSEOUS: Sclerotic lesions throughout the imaged skeleton, consistent with known metastatic disease. No evidence of extra osseous extension. No pathologic fracture.  Review of the MIP images confirms the above findings.  IMPRESSION 1. Negative for pulmonary embolism or other acute intrathoracic disease. 2. Mixed response to therapy. While periaortic and left supraclavicular lymphadenopathy has decreased in size, there is new adenopathy in the left axilla and an enlarging right adrenal gland metastasis. 3. Since 11/14/2012, new diffuse hepatic abnormality which could represent extensive infiltrative tumor or patchy steatosis. MRI could differentiate if clinically warranted. There is new ascites, small volume, which is presumably related.  SIGNATURE  Electronically Signed   By: Jorje Guild M.D.   On: 06/06/2013 14:12   Dg  Chest Portable 1 View  06/06/2013   CLINICAL DATA Shortness of breath, weakness  EXAM PORTABLE CHEST - 1 VIEW  COMPARISON CT ANGIO CHEST W/CM &/OR WO/CM dated 02/09/2013  FINDINGS Low lung volumes. There is a spiculated opacity at the right lung base medially. There is no other focal consolidation, pleural effusion or pneumothorax. Normal heart size.  There is thickening of the left peritracheal soft tissues concerning for adenopathy.  The osseous structures are unremarkable.  IMPRESSION Low lung volumes. Spiculated opacity at the right lung base medially which may reflect atelectasis versus pneumonia. Follow up PA and lateral radiographs of the chest are recommended.  There is thickening of the left peritracheal soft tissues concerning for adenopathy.  SIGNATURE  Electronically Signed   By:  Kathreen Devoid   On: 06/06/2013 13:15     EKG Interpretation None      MDM   Final diagnoses:  Diarrhea  Abnormal liver function test  Peripheral edema    New Prescriptions   DIPHENOXYLATE-ATROPINE (LOMOTIL) 2.5-0.025 MG PER TABLET    Take 1 tablet by mouth 4 (four) times daily as needed for diarrhea or loose stools.   FUROSEMIDE (LASIX) 40 MG TABLET    Take 1 tablet (40 mg total) by mouth daily as needed for fluid or edema.   POTASSIUM CHLORIDE SA (K-DUR,KLOR-CON) 20 MEQ TABLET    Take once daily with the furosemide    Plan discharge   Rolland Porter, MD, Alanson Aly, MD 06/06/13 1651

## 2013-06-06 NOTE — ED Notes (Addendum)
Pt states he was constipated last week, he took miralax for constipation last Wednesday and states he has had diarrhea since then. Pt states his diarrhea has gotten better (going less often, not as runny) since last week. Pt states he saw bright red blood in his stool earlier in the week, but has not seen it since.

## 2013-06-06 NOTE — Discharge Instructions (Signed)
Use the lomotil for diarrhea. No milk until the diarrhea is gone.  Take the furosemide with potassium as needed for fluid in legs.  Let Dr Hazeline Junker office know he had a CT scan today.   Return if he seems worse instead of better.

## 2013-06-06 NOTE — Progress Notes (Signed)
Patient and wife escorted to E.R via w/c, per dr Alen Blew.

## 2013-06-07 LAB — PSA: PSA: 3236 ng/mL — AB (ref <=4.00)

## 2013-06-10 ENCOUNTER — Encounter (HOSPITAL_COMMUNITY): Payer: Self-pay | Admitting: Emergency Medicine

## 2013-06-10 ENCOUNTER — Emergency Department (HOSPITAL_COMMUNITY): Payer: Medicare Other

## 2013-06-10 ENCOUNTER — Inpatient Hospital Stay (HOSPITAL_COMMUNITY)
Admission: EM | Admit: 2013-06-10 | Discharge: 2013-06-15 | DRG: 871 | Disposition: A | Discharge status: Hospice | Payer: Medicare Other | Attending: Internal Medicine | Admitting: Internal Medicine

## 2013-06-10 DIAGNOSIS — Z66 Do not resuscitate: Secondary | ICD-10-CM

## 2013-06-10 DIAGNOSIS — C61 Malignant neoplasm of prostate: Secondary | ICD-10-CM

## 2013-06-10 DIAGNOSIS — Z6841 Body Mass Index (BMI) 40.0 and over, adult: Secondary | ICD-10-CM

## 2013-06-10 DIAGNOSIS — R7881 Bacteremia: Secondary | ICD-10-CM

## 2013-06-10 DIAGNOSIS — R609 Edema, unspecified: Secondary | ICD-10-CM | POA: Diagnosis present

## 2013-06-10 DIAGNOSIS — R112 Nausea with vomiting, unspecified: Secondary | ICD-10-CM | POA: Diagnosis present

## 2013-06-10 DIAGNOSIS — A419 Sepsis, unspecified organism: Principal | ICD-10-CM

## 2013-06-10 DIAGNOSIS — L03119 Cellulitis of unspecified part of limb: Secondary | ICD-10-CM

## 2013-06-10 DIAGNOSIS — R627 Adult failure to thrive: Secondary | ICD-10-CM | POA: Diagnosis present

## 2013-06-10 DIAGNOSIS — R0989 Other specified symptoms and signs involving the circulatory and respiratory systems: Secondary | ICD-10-CM | POA: Diagnosis present

## 2013-06-10 DIAGNOSIS — Z923 Personal history of irradiation: Secondary | ICD-10-CM

## 2013-06-10 DIAGNOSIS — J189 Pneumonia, unspecified organism: Secondary | ICD-10-CM

## 2013-06-10 DIAGNOSIS — L0291 Cutaneous abscess, unspecified: Secondary | ICD-10-CM

## 2013-06-10 DIAGNOSIS — L0390 Cellulitis, unspecified: Secondary | ICD-10-CM

## 2013-06-10 DIAGNOSIS — C7951 Secondary malignant neoplasm of bone: Secondary | ICD-10-CM

## 2013-06-10 DIAGNOSIS — R599 Enlarged lymph nodes, unspecified: Secondary | ICD-10-CM | POA: Diagnosis present

## 2013-06-10 DIAGNOSIS — R188 Other ascites: Secondary | ICD-10-CM | POA: Diagnosis present

## 2013-06-10 DIAGNOSIS — F419 Anxiety disorder, unspecified: Secondary | ICD-10-CM

## 2013-06-10 DIAGNOSIS — R5383 Other fatigue: Secondary | ICD-10-CM

## 2013-06-10 DIAGNOSIS — I472 Ventricular tachycardia, unspecified: Secondary | ICD-10-CM | POA: Diagnosis present

## 2013-06-10 DIAGNOSIS — E785 Hyperlipidemia, unspecified: Secondary | ICD-10-CM | POA: Diagnosis present

## 2013-06-10 DIAGNOSIS — R000 Tachycardia, unspecified: Secondary | ICD-10-CM

## 2013-06-10 DIAGNOSIS — R7989 Other specified abnormal findings of blood chemistry: Secondary | ICD-10-CM | POA: Diagnosis present

## 2013-06-10 DIAGNOSIS — Z87891 Personal history of nicotine dependence: Secondary | ICD-10-CM

## 2013-06-10 DIAGNOSIS — F411 Generalized anxiety disorder: Secondary | ICD-10-CM | POA: Diagnosis present

## 2013-06-10 DIAGNOSIS — C770 Secondary and unspecified malignant neoplasm of lymph nodes of head, face and neck: Secondary | ICD-10-CM

## 2013-06-10 DIAGNOSIS — Z515 Encounter for palliative care: Secondary | ICD-10-CM

## 2013-06-10 DIAGNOSIS — I509 Heart failure, unspecified: Secondary | ICD-10-CM | POA: Diagnosis present

## 2013-06-10 DIAGNOSIS — R600 Localized edema: Secondary | ICD-10-CM

## 2013-06-10 DIAGNOSIS — C787 Secondary malignant neoplasm of liver and intrahepatic bile duct: Secondary | ICD-10-CM | POA: Diagnosis present

## 2013-06-10 DIAGNOSIS — R748 Abnormal levels of other serum enzymes: Secondary | ICD-10-CM

## 2013-06-10 DIAGNOSIS — Z79899 Other long term (current) drug therapy: Secondary | ICD-10-CM

## 2013-06-10 DIAGNOSIS — R197 Diarrhea, unspecified: Secondary | ICD-10-CM | POA: Diagnosis present

## 2013-06-10 DIAGNOSIS — L02419 Cutaneous abscess of limb, unspecified: Secondary | ICD-10-CM | POA: Diagnosis present

## 2013-06-10 DIAGNOSIS — I4729 Other ventricular tachycardia: Secondary | ICD-10-CM | POA: Diagnosis present

## 2013-06-10 DIAGNOSIS — R0609 Other forms of dyspnea: Secondary | ICD-10-CM | POA: Diagnosis present

## 2013-06-10 DIAGNOSIS — M7989 Other specified soft tissue disorders: Secondary | ICD-10-CM | POA: Diagnosis present

## 2013-06-10 DIAGNOSIS — C801 Malignant (primary) neoplasm, unspecified: Secondary | ICD-10-CM

## 2013-06-10 DIAGNOSIS — E8809 Other disorders of plasma-protein metabolism, not elsewhere classified: Secondary | ICD-10-CM | POA: Diagnosis present

## 2013-06-10 DIAGNOSIS — I872 Venous insufficiency (chronic) (peripheral): Secondary | ICD-10-CM | POA: Diagnosis present

## 2013-06-10 DIAGNOSIS — R0682 Tachypnea, not elsewhere classified: Secondary | ICD-10-CM | POA: Diagnosis present

## 2013-06-10 DIAGNOSIS — C7952 Secondary malignant neoplasm of bone marrow: Secondary | ICD-10-CM

## 2013-06-10 DIAGNOSIS — J438 Other emphysema: Secondary | ICD-10-CM | POA: Diagnosis present

## 2013-06-10 DIAGNOSIS — E860 Dehydration: Secondary | ICD-10-CM

## 2013-06-10 DIAGNOSIS — R5381 Other malaise: Secondary | ICD-10-CM | POA: Diagnosis present

## 2013-06-10 DIAGNOSIS — M79609 Pain in unspecified limb: Secondary | ICD-10-CM | POA: Diagnosis present

## 2013-06-10 HISTORY — DX: Pneumonia, unspecified organism: J18.9

## 2013-06-10 LAB — PROTIME-INR
INR: 1.49 (ref 0.00–1.49)
Prothrombin Time: 17.6 seconds — ABNORMAL HIGH (ref 11.6–15.2)

## 2013-06-10 LAB — URINE MICROSCOPIC-ADD ON

## 2013-06-10 LAB — COMPREHENSIVE METABOLIC PANEL
ALT: 65 U/L — AB (ref 0–53)
AST: 128 U/L — AB (ref 0–37)
Albumin: 2.2 g/dL — ABNORMAL LOW (ref 3.5–5.2)
Alkaline Phosphatase: 631 U/L — ABNORMAL HIGH (ref 39–117)
BILIRUBIN TOTAL: 4.4 mg/dL — AB (ref 0.3–1.2)
BUN: 20 mg/dL (ref 6–23)
CHLORIDE: 92 meq/L — AB (ref 96–112)
CO2: 22 mEq/L (ref 19–32)
CREATININE: 0.96 mg/dL (ref 0.50–1.35)
Calcium: 7.7 mg/dL — ABNORMAL LOW (ref 8.4–10.5)
GFR calc Af Amer: >90 mL/min (ref >90)
GFR, EST NON AFRICAN AMERICAN: 80 mL/min — AB (ref >90)
Glucose, Bld: 130 mg/dL — ABNORMAL HIGH (ref 70–99)
Potassium: 3.9 mEq/L (ref 3.7–5.3)
Sodium: 129 mEq/L — ABNORMAL LOW (ref 137–147)
Total Protein: 6.2 g/dL (ref 6.0–8.3)

## 2013-06-10 LAB — CBC WITH DIFFERENTIAL/PLATELET
BASOS ABS: 0.1 10*3/uL (ref 0.0–0.1)
Basophils Relative: 0 % (ref 0–1)
Eosinophils Absolute: 0 10*3/uL (ref 0.0–0.7)
Eosinophils Relative: 0 % (ref 0–5)
HCT: 39.5 % (ref 39.0–52.0)
HEMOGLOBIN: 13.7 g/dL (ref 13.0–17.0)
LYMPHS PCT: 3 % — AB (ref 12–46)
Lymphs Abs: 0.5 10*3/uL — ABNORMAL LOW (ref 0.7–4.0)
MCH: 29.5 pg (ref 26.0–34.0)
MCHC: 34.7 g/dL (ref 30.0–36.0)
MCV: 84.9 fL (ref 78.0–100.0)
Monocytes Absolute: 1.3 10*3/uL — ABNORMAL HIGH (ref 0.1–1.0)
Monocytes Relative: 9 % (ref 3–12)
NEUTROS ABS: 12.3 10*3/uL — AB (ref 1.7–7.7)
Neutrophils Relative %: 87 % — ABNORMAL HIGH (ref 43–77)
Platelets: 230 10*3/uL (ref 150–400)
RBC: 4.65 MIL/uL (ref 4.22–5.81)
RDW: 20.9 % — AB (ref 11.5–15.5)
WBC: 14.2 10*3/uL — ABNORMAL HIGH (ref 4.0–10.5)

## 2013-06-10 LAB — PRO B NATRIURETIC PEPTIDE: Pro B Natriuretic peptide (BNP): 123.5 pg/mL (ref 0–125)

## 2013-06-10 LAB — URINALYSIS, ROUTINE W REFLEX MICROSCOPIC
GLUCOSE, UA: NEGATIVE mg/dL
Ketones, ur: NEGATIVE mg/dL
Nitrite: NEGATIVE
PH: 5.5 (ref 5.0–8.0)
Protein, ur: NEGATIVE mg/dL
Specific Gravity, Urine: 1.02 (ref 1.005–1.030)
Urobilinogen, UA: 2 mg/dL — ABNORMAL HIGH (ref 0.0–1.0)

## 2013-06-10 LAB — I-STAT CG4 LACTIC ACID, ED: Lactic Acid, Venous: 3.1 mmol/L — ABNORMAL HIGH (ref 0.5–2.2)

## 2013-06-10 LAB — LIPASE, BLOOD: LIPASE: 65 U/L — AB (ref 11–59)

## 2013-06-10 MED ORDER — PIPERACILLIN-TAZOBACTAM 3.375 G IVPB 30 MIN
3.3750 g | Freq: Once | INTRAVENOUS | Status: AC
  Administered 2013-06-10: 3.375 g via INTRAVENOUS
  Filled 2013-06-10: qty 50

## 2013-06-10 MED ORDER — VANCOMYCIN HCL 10 G IV SOLR
2000.0000 mg | Freq: Once | INTRAVENOUS | Status: AC
  Administered 2013-06-10: 2000 mg via INTRAVENOUS
  Filled 2013-06-10: qty 2000

## 2013-06-10 MED ORDER — SODIUM CHLORIDE 0.9 % IV BOLUS (SEPSIS)
500.0000 mL | Freq: Once | INTRAVENOUS | Status: AC
  Administered 2013-06-10: 500 mL via INTRAVENOUS

## 2013-06-10 MED ORDER — LORAZEPAM 2 MG/ML IJ SOLN
1.0000 mg | Freq: Once | INTRAMUSCULAR | Status: AC
  Administered 2013-06-10: 1 mg via INTRAVENOUS

## 2013-06-10 MED ORDER — LORAZEPAM 2 MG/ML IJ SOLN
1.0000 mg | Freq: Once | INTRAMUSCULAR | Status: DC
  Filled 2013-06-10: qty 1

## 2013-06-10 MED ORDER — LORAZEPAM 2 MG/ML IJ SOLN
0.5000 mg | INTRAMUSCULAR | Status: DC | PRN
  Administered 2013-06-10: 0.5 mg via INTRAVENOUS
  Filled 2013-06-10: qty 1

## 2013-06-10 NOTE — H&P (Signed)
[facility] History and Physical  Donald Blair OZD:[id number] DOB: 1940/10/17 DOA: 06/10/2013  Referring physician:  PCP: [name], MD  Specialists:   Chief Complaint: Generalized weakness  HPI: Donald Blair is a 73 y.o. male  With a history of metastatic prostate cancer status post seed implantation as well as radiation therapy to the neck and upper mediastinum, presents with generalized weakness. Patient states that his generalized weakness started approximately one week ago at which point he had seen his oncologist and was sent to the emergency room for evaluation. Patient did have workup conducted and was sent home. Patient had his Lasix changed from 20 mg twice daily to 40 mg in the morning. Patient has had increasing leg swelling as well as redness over the last few days. Patient also admits to having nausea, vomiting, diarrhea which have resolved and occurred approximately one week ago.  Of note, patient was here 06/06/2013. CT angiogram as well as the abdomen was conducted showing hepatic mass. Patient was also found to have elevated LFTs at that time.  Review of Systems:  Constitutional: Denies fever, chills, diaphoresis, appetite change and fatigue.  HEENT: Denies photophobia, eye pain, redness, hearing loss, ear pain, congestion, sore throat, rhinorrhea, sneezing, mouth sores, trouble swallowing, neck pain, neck stiffness and tinnitus.   Respiratory: Complains of some shortness of breath. Denies any wheezing or cough at this time. Cardiovascular: Complains of lower extremity swelling, denies any chest pain or palpitations Gastrointestinal: Admits to diarrhea which has resolved, one week ago. Complains of abdominal distention. Complains of nausea and vomiting which have also resolved. Genitourinary: Denies dysuria, urgency, frequency, hematuria, flank pain and difficulty urinating.  Musculoskeletal: Complains of weakness. Skin: Denies pallor, rash and wound.    Neurological:  Complains of generalized weakness. Hematological: Denies adenopathy. Easy bruising, personal or family bleeding history  Psychiatric/Behavioral: Admits to anxiety.  Past Medical History  Diagnosis Date  . Cancer     prostate  . Hyperlipidemia   . Wears glasses   . Shortness of breath   . Arthritis   . 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 Lymphadenopathy, supraclavicular     left, prostate primary  . Prostate cancer 2005    xrt w/seed implant  . Hx of radiation therapy 04/18/13- 05/07/13    left neck/upper mediastinum 3500 cGy in 14 sessions   Past Surgical History  Procedure Laterality Date  . Tonsillectomy    . Hernia repair  1972    lt ing  . Knee arthroscopy  1999    right  . Knee arthroscopy  2001    left  . Transperineal implant of radiation seeds w/ ultrasound  2005    prostate  . Shoulder arthroscopy with rotator cuff repair Right 10/03/2012    Procedure: RIGHT SHOULDER ARTHROSCOPY WITH OPEN ROTATOR CUFF REPAIR AND DISTAL CLAVICLE EXCISION;  Surgeon: Cammie Sickle., MD;  Location: [facility];  Service: Orthopedics;  Laterality: Right;   Social History:  reports that he has quit smoking. His smokeless tobacco use includes Chew. He reports that he does not drink alcohol or use illicit drugs. Currently married lives at home with his wife.  No Known Allergies  History reviewed. No pertinent family history.   Prior to Admission medications   Medication Sig Start Date End Date Taking? Authorizing Provider  calcium citrate-vitamin D (CITRACAL+D) 315-200 MG-UNIT per tablet Take 1 tablet by mouth 2 (two) times daily.   Yes Historical Provider,  MD  enzalutamide (XTANDI) 40 MG capsule Take 4 capsules (160 mg total) by mouth daily. 06/01/13  Yes Wyatt Portela, MD  furosemide (LASIX) 40 MG tablet Take 1 tablet (40 mg total) by mouth daily as needed for fluid or edema. 06/06/13  Yes Janice Norrie, MD  Multiple Vitamins-Minerals  (MULTIVITAMIN WITH MINERALS) tablet Take 1 tablet by mouth daily.   Yes Historical Provider, MD  naproxen sodium (ANAPROX) 220 MG tablet Take 220 mg by mouth 2 (two) times daily with a meal.   Yes Historical Provider, MD  ondansetron (ZOFRAN) 8 MG tablet Take 1 tablet (8 mg total) by mouth every 12 (twelve) hours as needed for nausea. 12/11/12  Yes Maryanna Shape, NP  potassium chloride SA (K-DUR,KLOR-CON) 20 MEQ tablet Take 1 tablet (20 mEq total) by mouth daily. 12/11/12  Yes Maryanna Shape, NP   Physical Exam: Filed Vitals:   06/10/13 1952  BP: 132/81  Pulse: 127  Temp: 98 F (36.7 C)  Resp: 22     General: Well developed, well nourished, mild distress, appears stated age  HEENT: NCAT, PERRLA, EOMI, Icteic Sclera, mucous membranes dry  Neck: Supple, no JVD,   Cardiovascular: S1 S2 auscultated, tachycardic.  Respiratory: Clear to auscultation bilaterally with equal chest rise  Abdomen: Soft, nontender, nondistended, + bowel sounds  Extremities: warm dry without cyanosis clubbing.  Lower extremity erythema with 2+ pitting edema, some drainage.  Neuro: AAOx3, cranial nerves grossly intact.   Skin: Without rashes exudates or nodules, jaundice  Psych: Anxious  Labs on Admission:  Basic Metabolic Panel:  Recent Labs Lab 06/06/13 1005 06/10/13 2020  NA 132* 129*  K 3.5 3.9  CL  --  92*  CO2 20* 22  GLUCOSE 128 130*  BUN 14.5 20  CREATININE 0.8 0.96  CALCIUM 8.0* 7.7*   Liver Function Tests:  Recent Labs Lab 06/06/13 1005 06/10/13 2020  AST 172 Repeated and Verified* 128*  ALT 75* 65*  ALKPHOS 769* 631*  BILITOT 3.00* 4.4*  PROT 6.1* 6.2  ALBUMIN 2.3* 2.2*    Recent Labs Lab 06/10/13 2020  LIPASE 65*   No results found for this basename: AMMONIA,  in the last 168 hours CBC:  Recent Labs Lab 06/06/13 1005 06/10/13 2020  WBC 11.8* 14.2*  NEUTROABS 9.9* 12.3*  HGB 13.9 13.7  HCT 41.6 39.5  MCV 87.0 84.9  PLT 236 230   Cardiac  Enzymes:  Recent Labs Lab 06/06/13 1217  TROPONINI <0.30    BNP (last 3 results)  Recent Labs  06/06/13 1230 06/10/13 2020  PROBNP 69.6 123.5   CBG: No results found for this basename: GLUCAP,  in the last 168 hours  Radiological Exams on Admission: Dg Chest Portable 1 View  06/10/2013   CLINICAL DATA:  Dyspnea  EXAM: PORTABLE CHEST - 1 VIEW  COMPARISON:  Chest radiograph and chest CT June 06, 2013  FINDINGS: There is consolidation in the medial left base. There is underlying emphysematous change. Elsewhere, the lungs are clear. Heart size and pulmonary vascularity are normal. No adenopathy.  IMPRESSION: Medial left base infiltrate.  Underlying emphysema.   Electronically Signed   By: Lowella Grip M.D.   On: 06/10/2013 21:00    EKG: None  Assessment/Plan  Sepsis secondary to healthcare associated pneumonia Patient be admitted to the telemetry floor. Her only he does have tachycardia, tachypnea, with leukocytosis.  Chest x-ray shows the medial left base infiltrate with underlying emphysema. Patient was placed on broad-spectrum antibiotics  with vancomycin and Zosyn. Sputum culture and Gram stain as well as blood cultures are currently pending. Will maintain patient on nasal cannula to maintain his oxygen saturations above 92%.  Urine strep pneumonia and Legionella antigens pending.  Prostate cancer Patient has received seeding in the past and is currently on oral chemotherapy.  We'll continue Xtandi.  Will consult oncology, Dr. Alen Blew.    Anxiety PRN lorazepam.  Nausea and vomiting Likely secondary to patient's oral chemotherapy. We'll continue him on Zofran.  Dehydration Likely secondary to nausea and vomiting as well as diuretic use. Will place patient on normal saline with gentle rehydration.  Questionable lower extremities cellulitis Patient be placed on vancomycin and Zosyn for HCAP. May considerable extremity Doppler if does not improve, unlikely DVT.  Possible  related to hepatic abnormality seen on CT.  New diffuse hepatic abnormality seen on CT of the abdomen and pelvis with abnormal LFTs Patient had CT exam on 06/06/2013 which showed a new diffuse hepatic abnormality which could represent extensive infiltrative tumor or patchy steatosis. MRI could differentiate. Ascites, small volume is also noted. ED physician spoke to gastroenterology, equal physicians which will follow. Patient is currently having abdominal ultrasound.  LFTs are trending downward as compared to those done on 06/06/2013.  Generalized weakness Likely secondary to underlying processes as stated above. We'll consult physical therapy.   DVT prophylaxis: Lovenox  Code Status: Full  Condition: Guarded  Family Communication: Wife at bedside. Admission, patients condition and plan of care including tests being ordered have been discussed with the patient and wife who indicate understanding and agree with the plan and Code Status.  Disposition Plan: Admitted.   Time spent: 60 minutes  Dana Debo D.O. [facility] Pager [phone]  If 7PM-7AM, please contact night-coverage [email or web address] Password [facility] 06/10/2013, 11:32 PM

## 2013-06-10 NOTE — ED Provider Notes (Signed)
CSN: [id number]     Arrival date & time 06/10/13  1939 History   First MD Initiated Contact with Patient 06/10/13 1955     Chief Complaint  Patient presents with  . Extremity Weakness     (Consider location/radiation/quality/duration/timing/severity/associated sxs/prior Treatment) HPI Comments: 73 year old male presents with diffuse weakness. States the weakness started about 5-6 days ago. The patient saw his oncologist a few days after that and was sent to the ER for evaluation. There he had a workup done and was sent home. His Lasix was changed from 20 mg twice a day to 40 mg in the morning. The patient has been having leg swelling consistently but is gradually worsening over the past few days. He took 60 mg of Lasix yesterday. His legs become more erythematous than when he was here last time and they have since begun to weep. The patient has had worsening weakness since then. The wife is also noted that the patient states his become more yellow starting today. The patient is always short of breath but admits to maybe a little bit worse today than it was earlier. Denies any cough or chest pain. The wife also feels that anxiety is playing a large part in his symptoms. Denies any abdominal pain. He has noticed his abdomen has been swelling.   Past Medical History  Diagnosis Date  . Cancer     prostate  . Hyperlipidemia   . Wears glasses   . Shortness of breath   . Arthritis   . 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 Lymphadenopathy, supraclavicular     left, prostate primary  . Prostate cancer 2005    xrt w/seed implant  . Hx of radiation therapy 04/18/13- 05/07/13    left neck/upper mediastinum 3500 cGy in 14 sessions   Past Surgical History  Procedure Laterality Date  . Tonsillectomy    . Hernia repair  1972    lt ing  . Knee arthroscopy  1999    right  . Knee arthroscopy  2001    left  . Transperineal implant of radiation seeds w/ ultrasound  2005    prostate   . Shoulder arthroscopy with rotator cuff repair Right 10/03/2012    Procedure: RIGHT SHOULDER ARTHROSCOPY WITH OPEN ROTATOR CUFF REPAIR AND DISTAL CLAVICLE EXCISION;  Surgeon: Cammie Sickle., MD;  Location: [facility];  Service: Orthopedics;  Laterality: Right;   History reviewed. No pertinent family history. History  Substance Use Topics  . Smoking status: Former Research scientist (life sciences)  . Smokeless tobacco: Current User    Types: Chew  . Alcohol Use: No    Review of Systems  Constitutional: Negative for fever and chills.  Respiratory: Positive for shortness of breath. Negative for cough.   Cardiovascular: Positive for leg swelling. Negative for chest pain.  Gastrointestinal: Positive for diarrhea (over 1 week ago, has since resolved) and abdominal distention. Negative for nausea, vomiting and abdominal pain.  Genitourinary: Negative for dysuria.  Musculoskeletal: Positive for extremity weakness.  Neurological: Positive for weakness. Negative for headaches.  All other systems reviewed and are negative.      Allergies  Review of patient's allergies indicates no known allergies.  Home Medications   Current Outpatient Rx  Name  Route  Sig  Dispense  Refill  . calcium citrate-vitamin D (CITRACAL+D) 315-200 MG-UNIT per tablet   Oral   Take 1 tablet by mouth 2 (two) times daily.         Marland Kitchen  diphenoxylate-atropine (LOMOTIL) 2.5-0.025 MG per tablet   Oral   Take 1 tablet by mouth 4 (four) times daily as needed for diarrhea or loose stools.   5 tablet   0   . enzalutamide (XTANDI) 40 MG capsule   Oral   Take 4 capsules (160 mg total) by mouth daily.   120 capsule   0   . furosemide (LASIX) 20 MG tablet   Oral   Take 1 tablet (20 mg total) by mouth 2 (two) times daily.   60 tablet   2   . furosemide (LASIX) 40 MG tablet   Oral   Take 1 tablet (40 mg total) by mouth daily as needed for fluid or edema.   10 tablet   0   . Multiple Vitamins-Minerals (MULTIVITAMIN  WITH MINERALS) tablet   Oral   Take 1 tablet by mouth daily.         . naproxen sodium (ANAPROX) 220 MG tablet   Oral   Take 220 mg by mouth 2 (two) times daily with a meal.         . ondansetron (ZOFRAN) 8 MG tablet   Oral   Take 1 tablet (8 mg total) by mouth every 12 (twelve) hours as needed for nausea.   30 tablet   1   . polyethylene glycol powder (GLYCOLAX/MIRALAX) powder   Oral   Take 17 g by mouth daily.   3350 g   1   . potassium chloride SA (K-DUR,KLOR-CON) 20 MEQ tablet   Oral   Take 1 tablet (20 mEq total) by mouth daily.   30 tablet   1   . potassium chloride SA (K-DUR,KLOR-CON) 20 MEQ tablet      Take once daily with the furosemide   10 tablet   0   . pravastatin (PRAVACHOL) 40 MG tablet   Oral   Take 1 tablet by mouth daily.          BP 132/81  Pulse 127  Temp(Src) 98 F (36.7 C) (Oral)  Resp 22  Ht 5\' 11"  (1.803 m)  SpO2 96% Physical Exam  Nursing note and vitals reviewed. Constitutional: He is oriented to person, place, and time. He appears well-developed and well-nourished. He appears ill.  HENT:  Head: Normocephalic and atraumatic.  Right Ear: External ear normal.  Left Ear: External ear normal.  Nose: Nose normal.  Eyes: Right eye exhibits no discharge. Left eye exhibits no discharge.  Neck: Neck supple.  Cardiovascular: Regular rhythm, normal heart sounds and intact distal pulses.  Tachycardia present.   Pulmonary/Chest: Tachypnea noted.  Abdominal: Soft. There is no tenderness.  Musculoskeletal: He exhibits edema (bilateral pitting edema to lower extremities up to knee. Erythema from feet to mid lower leg with clear weeping).  Neurological: He is alert and oriented to person, place, and time.  Skin: Skin is warm and dry.  Jaundice to face    ED Course  Procedures (including critical care time) Labs Review Labs Reviewed  CBC WITH DIFFERENTIAL - Abnormal; Notable for the following:    WBC 14.2 (*)    RDW 20.9 (*)     Neutrophils Relative % 87 (*)    Neutro Abs 12.3 (*)    Lymphocytes Relative 3 (*)    Lymphs Abs 0.5 (*)    Monocytes Absolute 1.3 (*)    All other components within normal limits  COMPREHENSIVE METABOLIC PANEL - Abnormal; Notable for the following:    Sodium 129 (*)  Chloride 92 (*)    Glucose, Bld 130 (*)    Calcium 7.7 (*)    Albumin 2.2 (*)    AST 128 (*)    ALT 65 (*)    Alkaline Phosphatase 631 (*)    Total Bilirubin 4.4 (*)    GFR calc non Af Amer 80 (*)    All other components within normal limits  LIPASE, BLOOD - Abnormal; Notable for the following:    Lipase 65 (*)    All other components within normal limits  PROTIME-INR - Abnormal; Notable for the following:    Prothrombin Time 17.6 (*)    All other components within normal limits  I-STAT CG4 LACTIC ACID, ED - Abnormal; Notable for the following:    Lactic Acid, Venous 3.10 (*)    All other components within normal limits  CULTURE, BLOOD (ROUTINE X 2)  CULTURE, BLOOD (ROUTINE X 2)  PRO B NATRIURETIC PEPTIDE  URINALYSIS, ROUTINE W REFLEX MICROSCOPIC   Imaging Review Dg Chest Portable 1 View  06/10/2013   CLINICAL DATA:  Dyspnea  EXAM: PORTABLE CHEST - 1 VIEW  COMPARISON:  Chest radiograph and chest CT June 06, 2013  FINDINGS: There is consolidation in the medial left base. There is underlying emphysematous change. Elsewhere, the lungs are clear. Heart size and pulmonary vascularity are normal. No adenopathy.  IMPRESSION: Medial left base infiltrate.  Underlying emphysema.   Electronically Signed   By: Lowella Grip M.D.   On: 06/10/2013 21:00     EKG Interpretation None      MDM   Final diagnoses:  Sepsis  Elevated liver enzymes  Peripheral edema    Patient initially ill appearing, a large component of which seems to be anxiety related. His vitals improved with IV ativan. Still tachycardic, however, given some fluids. Concern for an infectious source. He has a left infiltrate, but denies cough.  However, given his tachypnea and frequent visits to medical facilities for his chemo and radiation will cover broadly. Could also have cellulitis on his extremities causing sepsis. Based on CT's done a few days ago, his elevated LFTs are concerning to be coming from liver metastasis given that this is a painless jaundice. Will admit to medicine. GI consulted, will see in AM.    Ephraim Hamburger, MD 06/10/13 2326

## 2013-06-10 NOTE — ED Notes (Signed)
Pt reports that he is weak increasingly so for the past week, states he has prostate CA and has been placed on medication for this that caused diarrhea 1.5 weeks ago but not since. Pt states that his bilateral lower legs have begun to weep, states he was told that his liver enzymes are elevated last Wednesday when he was seen in the ED for weakness, was placed on Lasix for increased fluid retention and has increased his dose again since.  Pt noted to be jaundiced, denies pain, a&o x4.

## 2013-06-11 ENCOUNTER — Encounter (HOSPITAL_COMMUNITY): Payer: Self-pay | Admitting: *Deleted

## 2013-06-11 DIAGNOSIS — C787 Secondary malignant neoplasm of liver and intrahepatic bile duct: Secondary | ICD-10-CM

## 2013-06-11 DIAGNOSIS — R627 Adult failure to thrive: Secondary | ICD-10-CM

## 2013-06-11 DIAGNOSIS — I509 Heart failure, unspecified: Secondary | ICD-10-CM

## 2013-06-11 DIAGNOSIS — C61 Malignant neoplasm of prostate: Secondary | ICD-10-CM

## 2013-06-11 DIAGNOSIS — R0609 Other forms of dyspnea: Secondary | ICD-10-CM

## 2013-06-11 DIAGNOSIS — R5381 Other malaise: Secondary | ICD-10-CM

## 2013-06-11 DIAGNOSIS — C7951 Secondary malignant neoplasm of bone: Secondary | ICD-10-CM

## 2013-06-11 DIAGNOSIS — R0989 Other specified symptoms and signs involving the circulatory and respiratory systems: Secondary | ICD-10-CM

## 2013-06-11 DIAGNOSIS — J189 Pneumonia, unspecified organism: Secondary | ICD-10-CM

## 2013-06-11 DIAGNOSIS — R5383 Other fatigue: Secondary | ICD-10-CM

## 2013-06-11 DIAGNOSIS — C7952 Secondary malignant neoplasm of bone marrow: Secondary | ICD-10-CM

## 2013-06-11 LAB — COMPREHENSIVE METABOLIC PANEL
ALK PHOS: 597 U/L — AB (ref 39–117)
ALT: 60 U/L — ABNORMAL HIGH (ref 0–53)
AST: 120 U/L — AB (ref 0–37)
Albumin: 2 g/dL — ABNORMAL LOW (ref 3.5–5.2)
BUN: 20 mg/dL (ref 6–23)
CO2: 24 meq/L (ref 19–32)
Calcium: 7.3 mg/dL — ABNORMAL LOW (ref 8.4–10.5)
Chloride: 94 mEq/L — ABNORMAL LOW (ref 96–112)
Creatinine, Ser: 1.16 mg/dL (ref 0.50–1.35)
GFR calc non Af Amer: 60 mL/min — ABNORMAL LOW (ref >90)
GFR, EST AFRICAN AMERICAN: 70 mL/min — AB (ref >90)
GLUCOSE: 127 mg/dL — AB (ref 70–99)
Potassium: 3.6 mEq/L — ABNORMAL LOW (ref 3.7–5.3)
SODIUM: 130 meq/L — AB (ref 137–147)
Total Bilirubin: 4.1 mg/dL — ABNORMAL HIGH (ref 0.3–1.2)
Total Protein: 5.7 g/dL — ABNORMAL LOW (ref 6.0–8.3)

## 2013-06-11 LAB — EXPECTORATED SPUTUM ASSESSMENT W GRAM STAIN, RFLX TO RESP C

## 2013-06-11 LAB — CBC
HCT: 38.1 % — ABNORMAL LOW (ref 39.0–52.0)
HEMOGLOBIN: 13.1 g/dL (ref 13.0–17.0)
MCH: 29.5 pg (ref 26.0–34.0)
MCHC: 34.4 g/dL (ref 30.0–36.0)
MCV: 85.8 fL (ref 78.0–100.0)
Platelets: 195 10*3/uL (ref 150–400)
RBC: 4.44 MIL/uL (ref 4.22–5.81)
RDW: 20.9 % — ABNORMAL HIGH (ref 11.5–15.5)
WBC: 11 10*3/uL — ABNORMAL HIGH (ref 4.0–10.5)

## 2013-06-11 LAB — INFLUENZA PANEL BY PCR (TYPE A & B)
H1N1FLUPCR: NOT DETECTED
INFLBPCR: NEGATIVE
Influenza A By PCR: NEGATIVE

## 2013-06-11 LAB — LEGIONELLA ANTIGEN, URINE

## 2013-06-11 LAB — STREP PNEUMONIAE URINARY ANTIGEN: STREP PNEUMO URINARY ANTIGEN: NEGATIVE

## 2013-06-11 LAB — TSH: TSH: 5.058 u[IU]/mL — AB (ref 0.350–4.500)

## 2013-06-11 LAB — HIV ANTIBODY (ROUTINE TESTING W REFLEX): HIV: NONREACTIVE

## 2013-06-11 LAB — LACTIC ACID, PLASMA: Lactic Acid, Venous: 2.2 mmol/L (ref 0.5–2.2)

## 2013-06-11 MED ORDER — VANCOMYCIN HCL 10 G IV SOLR
1250.0000 mg | Freq: Two times a day (BID) | INTRAVENOUS | Status: DC
  Administered 2013-06-11 – 2013-06-13 (×4): 1250 mg via INTRAVENOUS
  Filled 2013-06-11 (×6): qty 1250

## 2013-06-11 MED ORDER — ADULT MULTIVITAMIN W/MINERALS CH
1.0000 | ORAL_TABLET | Freq: Every day | ORAL | Status: DC
  Administered 2013-06-11 – 2013-06-15 (×5): 1 via ORAL
  Filled 2013-06-11 (×5): qty 1

## 2013-06-11 MED ORDER — ONDANSETRON HCL 4 MG PO TABS
8.0000 mg | ORAL_TABLET | Freq: Two times a day (BID) | ORAL | Status: DC | PRN
  Administered 2013-06-14: 8 mg via ORAL
  Filled 2013-06-11 (×2): qty 2

## 2013-06-11 MED ORDER — ENOXAPARIN SODIUM 40 MG/0.4ML ~~LOC~~ SOLN
40.0000 mg | SUBCUTANEOUS | Status: DC
  Administered 2013-06-11 – 2013-06-15 (×5): 40 mg via SUBCUTANEOUS
  Filled 2013-06-11 (×6): qty 0.4

## 2013-06-11 MED ORDER — ENZALUTAMIDE 40 MG PO CAPS: 160.0000 mg | ORAL_CAPSULE | Freq: Every day | ORAL | Status: DC

## 2013-06-11 MED ORDER — PIPERACILLIN-TAZOBACTAM 3.375 G IVPB
3.3750 g | Freq: Three times a day (TID) | INTRAVENOUS | Status: DC
  Administered 2013-06-11 – 2013-06-14 (×11): 3.375 g via INTRAVENOUS
  Filled 2013-06-11 (×12): qty 50

## 2013-06-11 MED ORDER — ALPRAZOLAM 0.25 MG PO TABS
0.2500 mg | ORAL_TABLET | Freq: Two times a day (BID) | ORAL | Status: DC | PRN
  Administered 2013-06-11 – 2013-06-15 (×4): 0.25 mg via ORAL
  Filled 2013-06-11 (×4): qty 1

## 2013-06-11 MED ORDER — SODIUM CHLORIDE 0.9 % IV SOLN
INTRAVENOUS | Status: DC
  Administered 2013-06-11: 05:00:00 via INTRAVENOUS
  Administered 2013-06-14: 20 mL/h via INTRAVENOUS

## 2013-06-11 MED ORDER — BIOTENE DRY MOUTH MT LIQD
15.0000 mL | Freq: Two times a day (BID) | OROMUCOSAL | Status: DC
  Administered 2013-06-11 – 2013-06-15 (×9): 15 mL via OROMUCOSAL

## 2013-06-11 NOTE — Progress Notes (Addendum)
PROGRESS NOTE  Donald Blair:[id number] DOB: 12-04-1940 DOA: 06/10/2013 PCP: [name], MD  Assessment/Plan: Sepsis secondary to HCAP - Continue vancomycin and Zosyn. Cultures are pending. Patient is on room air this morning with stable respiratory status Prostate cancer, stage IV - appreciate oncology input Anxiety - continue Xanax Questionable right lower extremities cellulitis - continue coverage with antibiotics as above Liver metastasis - we'll monitor LFTs. Oncology following. Generalized weakness - PT consult  Diet: heart Fluids: NS DVT Prophylaxis: Lovenox  Code Status: Full code Family Communication: Wife at the bedside  Disposition Plan: Inpatient  Consultants:  Oncology  Procedures:  None   Antibiotics Vancomycin 3/15>> Zosyn 3/15>>  HPI/Subjective: Feeling weak this morning, no breathing problems, no chest pain  Objective: Filed Vitals:   06/10/13 1952 06/10/13 2154 06/11/13 0100 06/11/13 0551  BP: 132/81  133/78 128/71  Pulse: 127  120 117  Temp: 98 F (36.7 C)  98.4 F (36.9 C) 97.7 F (36.5 C)  TempSrc: Oral  Oral Oral  Resp: 22  22 22   Height: 5\' 11"  (1.803 m)  5\' 11"  (1.803 m)   Weight:  134.7 kg (296 lb 15.4 oz) 135.4 kg (298 lb 8.1 oz)   SpO2: 96%  100% 99%    Intake/Output Summary (Last 24 hours) at 06/11/13 1121 Last data filed at 06/11/13 1117  Gross per 24 hour  Intake 1480.33 ml  Output    600 ml  Net 880.33 ml   Filed Weights   06/10/13 2154 06/11/13 0100  Weight: 134.7 kg (296 lb 15.4 oz) 135.4 kg (298 lb 8.1 oz)    Exam:   General:  NAD  Cardiovascular: regular rate and rhythm, without MRG  Respiratory: good air movement, clear to auscultation throughout, no wheezing, ronchi or rales  Abdomen: soft, not tender to palpation, positive bowel sounds  MSK: no peripheral edema. Chronic venous stasis changes bilaterally, possibly worse erythematous changes on the right lower extremity.  Neuro:  Nonfocal  Data Reviewed: Basic Metabolic Panel:  Recent Labs Lab 06/06/13 1005 06/10/13 2020  NA 132* 129*  K 3.5 3.9  CL  --  92*  CO2 20* 22  GLUCOSE 128 130*  BUN 14.5 20  CREATININE 0.8 0.96  CALCIUM 8.0* 7.7*   Liver Function Tests:  Recent Labs Lab 06/06/13 1005 06/10/13 2020  AST 172 Repeated and Verified* 128*  ALT 75* 65*  ALKPHOS 769* 631*  BILITOT 3.00* 4.4*  PROT 6.1* 6.2  ALBUMIN 2.3* 2.2*    Recent Labs Lab 06/10/13 2020  LIPASE 65*   No results found for this basename: AMMONIA,  in the last 168 hours CBC:  Recent Labs Lab 06/06/13 1005 06/10/13 2020  WBC 11.8* 14.2*  NEUTROABS 9.9* 12.3*  HGB 13.9 13.7  HCT 41.6 39.5  MCV 87.0 84.9  PLT 236 230   Cardiac Enzymes:  Recent Labs Lab 06/06/13 1217  TROPONINI <0.30   BNP (last 3 results)  Recent Labs  06/06/13 1230 06/10/13 2020  PROBNP 69.6 123.5   CBG: No results found for this basename: GLUCAP,  in the last 168 hours  Recent Results (from the past 240 hour(s))  CULTURE, EXPECTORATED SPUTUM-ASSESSMENT     Status: None   Collection Time    06/11/13  2:24 AM      Result Value Ref Range Status   Specimen Description SPUTUM   Final   Special Requests NONE   Final   Sputum evaluation     Final   Value:  THIS SPECIMEN IS ACCEPTABLE. RESPIRATORY CULTURE REPORT TO FOLLOW.   Report Status 06/11/2013 FINAL   Final  CULTURE, RESPIRATORY (NON-EXPECTORATED)     Status: None   Collection Time    06/11/13  2:24 AM      Result Value Ref Range Status   Specimen Description SPUTUM   Final   Special Requests NONE   Final   Gram Stain     Final   Value: MODERATE WBC PRESENT, PREDOMINANTLY PMN     FEW SQUAMOUS EPITHELIAL CELLS PRESENT     MODERATE GRAM POSITIVE RODS     FEW GRAM POSITIVE COCCI IN PAIRS     FEW YEAST   Culture PENDING   Incomplete   Report Status PENDING   Incomplete     Studies: US Abdomen Limited  06/11/2013   CLINICAL DATA:  History of metastatic prostate  cancer. Weakness. Possible metastatic disease the liver by CT abdomen and pelvis.  EXAM: US ABDOMEN LIMITED - RIGHT UPPER QUADRANT  COMPARISON:  CT chest, abdomen and pelvis 06/06/2013.  FINDINGS: Gallbladder:  The gallbladder is contracted with stones present. No wall thickening or pericholecystic fluid.  Common bile duct:  Diameter: 0.3 cm.  Liver:  As seen on CT scan, innumerable very large hypoechoic lesions are present in the liver. No intrahepatic biliary ductal dilatation is identified.  IMPRESSION: Multiple of hepatic lesions likely due to metastatic prostate cancer. As noted on report of prior CT abdomen and pelvis, MRI could be used for further evaluation.  Gallstones without evidence cholecystitis.   Electronically Signed   By: Inge Rise M.D.   On: 06/11/2013 00:11   Dg Chest Portable 1 View  06/10/2013   CLINICAL DATA:  Dyspnea  EXAM: PORTABLE CHEST - 1 VIEW  COMPARISON:  Chest radiograph and chest CT June 06, 2013  FINDINGS: There is consolidation in the medial left base. There is underlying emphysematous change. Elsewhere, the lungs are clear. Heart size and pulmonary vascularity are normal. No adenopathy.  IMPRESSION: Medial left base infiltrate.  Underlying emphysema.   Electronically Signed   By: Lowella Grip M.D.   On: 06/10/2013 21:00    Scheduled Meds: . antiseptic oral rinse  15 mL Mouth Rinse BID  . enoxaparin (LOVENOX) injection  40 mg Subcutaneous Q24H  . multivitamin with minerals  1 tablet Oral Daily  . piperacillin-tazobactam (ZOSYN)  IV  3.375 g Intravenous Q8H  . vancomycin  1,250 mg Intravenous Q12H   Continuous Infusions: . sodium chloride 100 mL/hr at 06/11/13 2119    Principal Problem:   Sepsis Active Problems:   Prostate cancer   Healthcare-associated pneumonia   Cellulitis   Dehydration   Tachycardia   Anxiety   Time spent: 35  This note has been created with Surveyor, quantity. Any  transcriptional errors are unintentional.   Marzetta Board, MD [facility] Pager [phone]. If 7 PM - 7 AM, please contact night-coverage at [email or web address], password [email or web address] 06/11/2013, 11:21 AM  LOS: 1 day

## 2013-06-11 NOTE — Progress Notes (Signed)
ANTIBIOTIC CONSULT NOTE - INITIAL  Pharmacy Consult for Vancomycin & Zosyn Indication: Sepsis due to HCAP  No Known Allergies  Patient Measurements: Height: 5\' 11"  (180.3 cm) Weight: 296 lb 15.4 oz (134.7 kg) IBW/kg (Calculated) : 75.3  Vital Signs: Temp: 98 F (36.7 C) (03/15 1952) Temp src: Oral (03/15 1952) BP: 132/81 mmHg (03/15 1952) Pulse Rate: 127 (03/15 1952) Intake/Output from previous day:   Intake/Output from this shift:    Labs:  Recent Labs  06/10/13 2020  WBC 14.2*  HGB 13.7  PLT 230  CREATININE 0.96   Estimated Creatinine Clearance: 94.6 ml/min (by C-G formula based on Cr of 0.96). No results found for this basename: VANCOTROUGH, VANCOPEAK, VANCORANDOM, GENTTROUGH, GENTPEAK, GENTRANDOM, TOBRATROUGH, TOBRAPEAK, TOBRARND, AMIKACINPEAK, AMIKACINTROU, AMIKACIN,  in the last 72 hours   Microbiology: No results found for this or any previous visit (from the past 720 hour(s)).  Medical History: Past Medical History  Diagnosis Date  . Cancer     prostate  . Hyperlipidemia   . Wears glasses   . Shortness of breath   . Arthritis   . 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 Lymphadenopathy, supraclavicular     left, prostate primary  . Prostate cancer 2005    xrt w/seed implant  . Hx of radiation therapy 04/18/13- 05/07/13    left neck/upper mediastinum 3500 cGy in 14 sessions  . Pneumonia     Medications:  Scheduled:  . enoxaparin (LOVENOX) injection  40 mg Subcutaneous Q24H  . enzalutamide  160 mg Oral Daily  . multivitamin with minerals  1 tablet Oral Daily  . vancomycin  2,000 mg Intravenous Once   Infusions:  . sodium chloride     Assessment:  73 yr male with complaint of weakness.  H/O metastatic prostate cancer (recent CT showed hepatic mass)  Chest XRay shows left base infiltrate  CrCl (n) ~ 68 ml/min  Vancomycin 2gm x 1 given @ 23:53 (3/15) and Zosyn 3.375gm x 1 given @ 22:57 (3/15)  IV Vancomycin and Zosyn per  pharmacy dosing ordered for Sepsis due to HCAP  Cultures ordered  Goal of Therapy:  Vancomycin trough level 15-20 mcg/ml  Plan:  Measure antibiotic drug levels at steady state Follow up culture results Zosyn 3.375gm IV q8h (each dose infused over 4 hrs) Vancomycin 1250mg  IV q12h  Keilon Ressel, Toribio Harbour, PharmD 06/11/2013,12:57 AM

## 2013-06-11 NOTE — Progress Notes (Signed)
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 He has metastatic disease to the bone and lymph nodes. His initial diagnosis was in 2005 with a Gleason score 3+4 equals 7 and a PSA of 7.   Prior Therapy:  1. He underwent external beam radiation with seed implants completed in July 2005.  2. He developed PSA recurrence in July 2010 but his prostate biopsy was negative at that time.  3. He started androgen deprivation February 2011 due to a rapid doubling of his PSA.  4. He received intermittent androgen deprivation in February 2012 until March 2013 her he developed a rise in his PSA indicating castration resistant disease.  5. He was treated as part of the STRIVT trial in May of 2013 after developing measurable disease with retroperitoneal lymphadenopathy and bone metastasis.  6. In January 2014 he developed further progression of his disease and was treated with Provenge immune therapy in April 2014.  7. Zytiga 1000 mg daily 11/14/12 through 03/27/13. Stopped due to rising PSA and edema.   Current therapy:  1. Xtandi 160 mg daily started on 03/2013  2. He receives Lupron at [facility] urology. Plans to begin receiving this here in April 2015.  3. He receives Niger monthly at [facility] urology. This will be given at the [place] starting on 04/27/2013.  Patient was hospitalized on 06/10/2013 with complaints of nausea, vomiting, diarrhea and failure. He was also found to be tachypneic and tachycardic with a possible infiltrate on his chest x-ray. CT scan images also indicate possible hepatic metastasis.   Upon my evaluation today, he still dyspneic but comfortable. He has no abdominal pain or distention at this time. He is reporting increased lower extremity edema.  Objective:  Vital signs in last 24 hours: Temp:  [97.7 F (36.5 C)-98.4 F (36.9 C)] 97.7 F (36.5 C) (03/16 0551) Pulse Rate:  [117-127] 117  (03/16 0551) Resp:  [22] 22 (03/16 0551) BP: (128-133)/(71-81) 128/71 mmHg (03/16 0551) SpO2:  [96 %-100 %] 99 % (03/16 0551) Weight:  [296 lb 15.4 oz (134.7 kg)-298 lb 8.1 oz (135.4 kg)] 298 lb 8.1 oz (135.4 kg) (03/16 0100) Weight change:  Last BM Date: 06/10/13  Intake/Output from previous day: 03/15 0701 - 03/16 0700 In: 1240.3 [P.O.:222; I.V.:468.3; IV Piggyback:550] Out: 175 [Urine:175]  Mouth: mucous membranes moist, pharynx normal without lesions Resp: rales bilaterally Cardio: regular rate and rhythm, S1, S2 normal, no murmur, click, rub or gallop GI: soft, non-tender; bowel sounds normal; no masses,  no organomegaly Extremities: edema 2+ bilaterally    Lab Results:  Recent Labs  06/10/13 2020  WBC 14.2*  HGB 13.7  HCT 39.5  PLT 230    BMET  Recent Labs  06/10/13 2020  NA 129*  K 3.9  CL 92*  CO2 22  GLUCOSE 130*  BUN 20  CREATININE 0.96  CALCIUM 7.7*    Studies/Results: US Abdomen Limited  06/11/2013   CLINICAL DATA:  History of metastatic prostate cancer. Weakness. Possible metastatic disease the liver by CT abdomen and pelvis.  EXAM: US ABDOMEN LIMITED - RIGHT UPPER QUADRANT  COMPARISON:  CT chest, abdomen and pelvis 06/06/2013.  FINDINGS: Gallbladder:  The gallbladder is contracted with stones present. No wall thickening or pericholecystic fluid.  Common bile duct:  Diameter: 0.3 cm.  Liver:  As seen on CT scan, innumerable very large hypoechoic lesions are present in the liver. No intrahepatic biliary ductal dilatation is identified.  IMPRESSION: Multiple of hepatic lesions likely due to metastatic prostate cancer. As noted on report of prior CT abdomen and pelvis, MRI could be used for further evaluation.  Gallstones without evidence cholecystitis.   Electronically Signed   By: Inge Rise M.D.   On: 06/11/2013 00:11   Dg Chest Portable 1 View  06/10/2013   CLINICAL DATA:  Dyspnea  EXAM: PORTABLE CHEST - 1 VIEW  COMPARISON:  Chest radiograph and  chest CT June 06, 2013  FINDINGS: There is consolidation in the medial left base. There is underlying emphysematous change. Elsewhere, the lungs are clear. Heart size and pulmonary vascularity are normal. No adenopathy.  IMPRESSION: Medial left base infiltrate.  Underlying emphysema.   Electronically Signed   By: Lowella Grip M.D.   On: 06/10/2013 21:00    Medications: I have reviewed the patient's current medications.  Assessment/Plan:   73 year old gentleman with the following issues:  1. Advanced prostate cancer with metastatic disease to the bone and now possibly to the liver. His last PSA had dramatically increased despite being on Xtandi. I feel that his cancer is progressing rather rapidly could also be contributing to his failure to thrive. I will discontinue Xtandi at this point given the fact that it's adding very little in his care. Treatment options were discussed today with Mr. Zick and his wife. We would consider systemic chemotherapy at this particular setting but I fear that his performance status and overall health might not allow [place] to do that. I don't think he is the greatest candidate for aggressive therapy and I might favor hospice care at this time. We will like to see how he does in the next 24-48 hours but he doesn't have any improvement I would favor the hospice route at that time.  2. Weakness and failure to thrive: Possibly related to a healthcare associated pneumonia possibly congestive heart failure. I agree with the current management I appreciate the care of by the hospitalist team.  3. Prognosis: His prognosis is relatively poor especially in the setting of advanced prostate cancer and possible liver metastasis. I favor a DO NOT RESUSCITATE CODE STATUS and palliative care medicine services as needed I certainly do not have any objection of getting them involved.    LOS: 1 day   [facility] 06/11/2013, 8:28 AM

## 2013-06-11 NOTE — Consult Note (Signed)
[facility] Consult Note  Referring Provider: No ref. provider found Primary Care Physician:  [name], MD Primary Gastroenterologist:  Dr.  Laurel Dimmer Complaint: Weakness and edema of the legs HPI: Donald Blair is an 73 y.o. white male  with widely metastatic prostate cancer resistant to chemotherapy who presented to the emergency room with weakness and edema of the legs. He underwent abdominal CT scan which showed innumerable large hypoechoic lesions in the liver. He had an alkaline phosphatase of greater than 700 and a bilirubin of greater than 4. There was no biliary ductal dilatation. The patient denies any history of prior liver disease. He does not drink alcohol and has never had hepatitis.  Past Medical History  Diagnosis Date  . Cancer     prostate  . Hyperlipidemia   . Wears glasses   . Shortness of breath   . Arthritis   . 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 Lymphadenopathy, supraclavicular     left, prostate primary  . Prostate cancer 2005    xrt w/seed implant  . Hx of radiation therapy 04/18/13- 05/07/13    left neck/upper mediastinum 3500 cGy in 14 sessions  . Pneumonia     Past Surgical History  Procedure Laterality Date  . Tonsillectomy    . Hernia repair  1972    lt ing  . Knee arthroscopy  1999    right  . Knee arthroscopy  2001    left  . Transperineal implant of radiation seeds w/ ultrasound  2005    prostate  . Shoulder arthroscopy with rotator cuff repair Right 10/03/2012    Procedure: RIGHT SHOULDER ARTHROSCOPY WITH OPEN ROTATOR CUFF REPAIR AND DISTAL CLAVICLE EXCISION;  Surgeon: Cammie Sickle., MD;  Location: [facility];  Service: Orthopedics;  Laterality: Right;    Medications Prior to Admission  Medication Sig Dispense Refill  . calcium citrate-vitamin D (CITRACAL+D) 315-200 MG-UNIT per tablet Take 1 tablet by mouth 2 (two) times daily.      . enzalutamide (XTANDI) 40 MG capsule Take 4  capsules (160 mg total) by mouth daily.  120 capsule  0  . furosemide (LASIX) 40 MG tablet Take 1 tablet (40 mg total) by mouth daily as needed for fluid or edema.  10 tablet  0  . Multiple Vitamins-Minerals (MULTIVITAMIN WITH MINERALS) tablet Take 1 tablet by mouth daily.      . naproxen sodium (ANAPROX) 220 MG tablet Take 220 mg by mouth 2 (two) times daily with a meal.      . ondansetron (ZOFRAN) 8 MG tablet Take 1 tablet (8 mg total) by mouth every 12 (twelve) hours as needed for nausea.  30 tablet  1  . potassium chloride SA (K-DUR,KLOR-CON) 20 MEQ tablet Take 1 tablet (20 mEq total) by mouth daily.  30 tablet  1    Allergies: No Known Allergies  History reviewed. No pertinent family history.  Social History:  reports that he has quit smoking. His smokeless tobacco use includes Chew. He reports that he does not drink alcohol or use illicit drugs.  Review of Systems: negative except as above   Blood pressure 128/71, pulse 117, temperature 97.7 F (36.5 C), temperature source Oral, resp. rate 22, height 5' 11"  (1.803 m), weight 135.4 kg (298 lb 8.1 oz), SpO2 99.00%. Head: Normocephalic, without obvious abnormality, atraumatic Neck: no adenopathy, no carotid bruit, no JVD, supple, symmetrical, trachea midline and thyroid not enlarged, symmetric, no tenderness/mass/nodules Resp:  clear to auscultation bilaterally Cardio: regular rate and rhythm, S1, S2 normal, no murmur, click, rub or gallop GI: Abdomen soft moderately distended. Liver edge is not palpable Extremities: extremities normal, atraumatic, no cyanosis or edema  Results for orders placed during the hospital encounter of 06/10/13 (from the past 48 hour(s))  CBC WITH DIFFERENTIAL     Status: Abnormal   Collection Time    06/10/13  8:20 PM      Result Value Ref Range   WBC 14.2 (*) 4.0 - 10.5 K/uL   RBC 4.65  4.22 - 5.81 MIL/uL   Hemoglobin 13.7  13.0 - 17.0 g/dL   HCT 39.5  39.0 - 52.0 %   MCV 84.9  78.0 - 100.0 fL   MCH  29.5  26.0 - 34.0 pg   MCHC 34.7  30.0 - 36.0 g/dL   RDW 20.9 (*) 11.5 - 15.5 %   Platelets 230  150 - 400 K/uL   Neutrophils Relative % 87 (*) 43 - 77 %   Neutro Abs 12.3 (*) 1.7 - 7.7 K/uL   Lymphocytes Relative 3 (*) 12 - 46 %   Lymphs Abs 0.5 (*) 0.7 - 4.0 K/uL   Monocytes Relative 9  3 - 12 %   Monocytes Absolute 1.3 (*) 0.1 - 1.0 K/uL   Eosinophils Relative 0  0 - 5 %   Eosinophils Absolute 0.0  0.0 - 0.7 K/uL   Basophils Relative 0  0 - 1 %   Basophils Absolute 0.1  0.0 - 0.1 K/uL  COMPREHENSIVE METABOLIC PANEL     Status: Abnormal   Collection Time    06/10/13  8:20 PM      Result Value Ref Range   Sodium 129 (*) 137 - 147 mEq/L   Potassium 3.9  3.7 - 5.3 mEq/L   Chloride 92 (*) 96 - 112 mEq/L   CO2 22  19 - 32 mEq/L   Glucose, Bld 130 (*) 70 - 99 mg/dL   BUN 20  6 - 23 mg/dL   Creatinine, Ser 0.96  0.50 - 1.35 mg/dL   Calcium 7.7 (*) 8.4 - 10.5 mg/dL   Total Protein 6.2  6.0 - 8.3 g/dL   Albumin 2.2 (*) 3.5 - 5.2 g/dL   AST 128 (*) 0 - 37 U/L   ALT 65 (*) 0 - 53 U/L   Alkaline Phosphatase 631 (*) 39 - 117 U/L   Total Bilirubin 4.4 (*) 0.3 - 1.2 mg/dL   GFR calc non Af Amer 80 (*) >90 mL/min   GFR calc Af Amer >90  >90 mL/min   Comment: (NOTE)     The eGFR has been calculated using the CKD EPI equation.     This calculation has not been validated in all clinical situations.     eGFR's persistently <90 mL/min signify possible Chronic Kidney     Disease.  LIPASE, BLOOD     Status: Abnormal   Collection Time    06/10/13  8:20 PM      Result Value Ref Range   Lipase 65 (*) 11 - 59 U/L  PRO B NATRIURETIC PEPTIDE     Status: None   Collection Time    06/10/13  8:20 PM      Result Value Ref Range   Pro B Natriuretic peptide (BNP) 123.5  0 - 125 pg/mL  PROTIME-INR     Status: Abnormal   Collection Time    06/10/13  8:20 PM  Result Value Ref Range   Prothrombin Time 17.6 (*) 11.6 - 15.2 seconds   INR 1.49  0.00 - 1.49  I-STAT CG4 LACTIC ACID, ED     Status:  Abnormal   Collection Time    06/10/13  8:35 PM      Result Value Ref Range   Lactic Acid, Venous 3.10 (*) 0.5 - 2.2 mmol/L  URINALYSIS, ROUTINE W REFLEX MICROSCOPIC     Status: Abnormal   Collection Time    06/10/13 10:41 PM      Result Value Ref Range   Color, Urine ORANGE (*) YELLOW   Comment: BIOCHEMICALS MAY BE AFFECTED BY COLOR   APPearance CLOUDY (*) CLEAR   Specific Gravity, Urine 1.020  1.005 - 1.030   pH 5.5  5.0 - 8.0   Glucose, UA NEGATIVE  NEGATIVE mg/dL   Hgb urine dipstick LARGE (*) NEGATIVE   Bilirubin Urine MODERATE (*) NEGATIVE   Ketones, ur NEGATIVE  NEGATIVE mg/dL   Protein, ur NEGATIVE  NEGATIVE mg/dL   Urobilinogen, UA 2.0 (*) 0.0 - 1.0 mg/dL   Nitrite NEGATIVE  NEGATIVE   Leukocytes, UA TRACE (*) NEGATIVE  URINE MICROSCOPIC-ADD ON     Status: None   Collection Time    06/10/13 10:41 PM      Result Value Ref Range   WBC, UA 3-6  <3 WBC/hpf   RBC / HPF 11-20  <3 RBC/hpf   Bacteria, UA RARE  RARE  STREP PNEUMONIAE URINARY ANTIGEN     Status: None   Collection Time    06/11/13 12:05 AM      Result Value Ref Range   Strep Pneumo Urinary Antigen NEGATIVE  NEGATIVE   Comment: PERFORMED AT [facility]                Infection due to S. pneumoniae     cannot be absolutely ruled out     since the antigen present     may be below the detection limit     of the test.     Performed at [facility], EXPECTORATED SPUTUM-ASSESSMENT     Status: None   Collection Time    06/11/13  2:24 AM      Result Value Ref Range   Specimen Description SPUTUM     Special Requests NONE     Sputum evaluation       Value: THIS SPECIMEN IS ACCEPTABLE. RESPIRATORY CULTURE REPORT TO FOLLOW.   Report Status 06/11/2013 FINAL     US Abdomen Limited  06/11/2013   CLINICAL DATA:  History of metastatic prostate cancer. Weakness. Possible metastatic disease the liver by CT abdomen and pelvis.  EXAM: US ABDOMEN LIMITED - RIGHT UPPER QUADRANT  COMPARISON:  CT  chest, abdomen and pelvis 06/06/2013.  FINDINGS: Gallbladder:  The gallbladder is contracted with stones present. No wall thickening or pericholecystic fluid.  Common bile duct:  Diameter: 0.3 cm.  Liver:  As seen on CT scan, innumerable very large hypoechoic lesions are present in the liver. No intrahepatic biliary ductal dilatation is identified.  IMPRESSION: Multiple of hepatic lesions likely due to metastatic prostate cancer. As noted on report of prior CT abdomen and pelvis, MRI could be used for further evaluation.  Gallstones without evidence cholecystitis.   Electronically Signed   By: Inge Rise M.D.   On: 06/11/2013 00:11   Dg Chest Portable 1 View  06/10/2013   CLINICAL DATA:  Dyspnea  EXAM: PORTABLE CHEST -  1 VIEW  COMPARISON:  Chest radiograph and chest CT June 06, 2013  FINDINGS: There is consolidation in the medial left base. There is underlying emphysematous change. Elsewhere, the lungs are clear. Heart size and pulmonary vascularity are normal. No adenopathy.  IMPRESSION: Medial left base infiltrate.  Underlying emphysema.   Electronically Signed   By: Lowella Grip M.D.   On: 06/10/2013 21:00    Assessment: Extensive liver metastases from prostate cancer almost certainly  Causing his elevated liver function tests. Plan:  Per oncology note, agreed that this is the primary process causing his liver function tests to be elevated and no hepatology workup is indicated. No abdominal pain or dominant stricture that would be palliated by ERCP or stent placement. Will defer all workup to oncology who is  considering hospice route if appropriate. We'll sign off for now. Please call further GI input needed.  Zedekiah Hinderman C 06/11/2013, 9:18 AM

## 2013-06-11 NOTE — Evaluation (Signed)
Occupational Therapy Evaluation Patient Details Name: Donald Blair MRN: [id number] DOB: [birth year] Today's Date: 06/11/2013 Time: 5916-3846 OT Time Calculation (min): 14 min  OT Assessment / Plan / Recommendation History of present illness 73 yo male admitted with sepsis. 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    Clinical Impression   Pt presents to OT with decreased I with ADL activity due to problems listed below. Pt will benefit from skilled OT to increase I with ADL activity and return to PLOF    OT Assessment  Patient needs continued OT Services    Follow Up Recommendations  Home health OT;SNF;Supervision/Assistance - 24 hour       Equipment Recommendations  3 in 1 bedside comode       Frequency  Min 2X/week    Precautions / Restrictions Precautions Precautions: Fall Restrictions Weight Bearing Restrictions: No       ADL  Grooming: Minimal assistance Where Assessed - Grooming: Unsupported sitting Upper Body Bathing: Moderate assistance Where Assessed - Upper Body Bathing: Unsupported sitting Lower Body Bathing: Maximal assistance Where Assessed - Lower Body Bathing: Supported sit to stand Upper Body Dressing: Minimal assistance Where Assessed - Upper Body Dressing: Unsupported sitting Lower Body Dressing: Maximal assistance Where Assessed - Lower Body Dressing: Supported sit to stand Toilet Transfer: Minimal assistance Toilet Transfer Method: Sit to stand;Stand pivot Toileting - Clothing Manipulation and Hygiene: Moderate assistance Where Assessed - Toileting Clothing Manipulation and Hygiene: Standing Transfers/Ambulation Related to ADLs: Pt very anxious during OT session.    OT Diagnosis: Generalized weakness  OT Problem List: Decreased strength;Decreased activity tolerance OT Treatment Interventions: Self-care/ADL training;DME and/or AE instruction;Patient/family education   OT Goals(Current goals can be found in the care plan  section) Acute Rehab OT Goals Patient Stated Goal: pt and wife would like to return home OT Goal Formulation: With patient Time For Goal Achievement: 06/25/13 Potential to Achieve Goals: Fair ADL Goals Pt Will Perform Grooming: with set-up;standing Pt Will Perform Lower Body Dressing: with min assist;sit to/from stand;with adaptive equipment Pt Will Transfer to Toilet: bedside commode;with supervision Pt Will Perform Toileting - Clothing Manipulation and hygiene: sit to/from stand;with supervision  Visit Information  Last OT Received On: 06/11/13 Assistance Needed: +1 History of Present Illness: 73 yo male admitted with sepsis. 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        Prior [place] expects to be discharged to:: Private residence Living Arrangements: Spouse/significant other Available Help at Discharge: Family Type of Home: House Home Access: Stairs to enter Technical brewer of Steps: 2 Home Layout: One [place]: Environmental consultant - 2 wheels;Cane - single point Prior Function Level of Independence: Needs assistance Gait / Transfers Assistance Needed: pt has been using RW for about the last week or so.  ADL's / Homemaking Assistance Needed: Wife is assisting with sponge bathing Communication Communication: No difficulties         Vision/Perception Vision - History Patient Visual Report: No change from baseline   Cognition  Cognition Arousal/Alertness: Awake/alert Behavior During Therapy: WFL for tasks assessed/performed Overall Cognitive Status: Within Functional Limits for tasks assessed    Extremity/Trunk Assessment Upper Extremity Assessment Upper Extremity Assessment: Generalized weakness Lower Extremity Assessment Lower Extremity Assessment: Generalized weakness Cervical / Trunk Assessment Cervical / Trunk Assessment: Normal     Mobility Bed Mobility General bed mobility comments: pt  sitting in recliner Transfers Overall transfer level: Needs assistance Equipment  used: Rolling walker (2 wheeled) Transfers: Sit to/from [employer] to Stand: Min assist Stand pivot transfers: Mod assist General transfer comment: Assist to rise, stabilize, control descent. VCs safety, technique, hand placement. Stand pivot x 2, recliner <>BSC with walker.            End of Session OT - End of Session Activity Tolerance: Patient limited by fatigue Patient left: in chair;with call bell/phone within reach;with family/visitor present Nurse Communication: Mobility status  GO     Betsy Pries 06/11/2013, 11:53 AM

## 2013-06-11 NOTE — Evaluation (Signed)
Physical Therapy Evaluation Patient Details Name: Donald Blair MRN: [id number] DOB: 1940-05-23 Today's Date: 06/11/2013 Time: 7262-0355 PT Time Calculation (min): 24 min  PT Assessment / Plan / Recommendation History of Present Illness  73 yo male admitted with sepsis. 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   Clinical Impression  On eval, pt required Min-Mod assist for mobility-able to perform stand pivot x 2 with RW-deferred ambulation at this time due to fatigue. Pt/wife both report they would like to return home. May need to consider ST rehab placement unless mobility improves enough to where wife can mange with pt at home.     PT Assessment  Patient needs continued PT services    Follow Up Recommendations  Home health PT;Supervision/Assistance - 24 hour;SNF (depending on progress)    Does the patient have the potential to tolerate intense rehabilitation      Barriers to Discharge        Equipment Recommendations  None recommended by PT    Recommendations for Other Services OT consult   Frequency Min 3X/week    Precautions / Restrictions Precautions Precautions: Fall Restrictions Weight Bearing Restrictions: No   Pertinent Vitals/Pain Pt denies pain      Mobility  Bed Mobility General bed mobility comments: pt sitting in recliner Transfers Overall transfer level: Needs assistance Equipment used: Rolling walker (2 wheeled) Transfers: Sit to/from [facility] Sit to Stand: Min assist Stand pivot transfers: Mod assist General transfer comment: Assist to rise, stabilize, control descent. VCs safety, technique, hand placement. Stand pivot x 2, recliner <>BSC with walker.  Ambulation/Gait General Gait Details: Deferred at this time due to fatigue.     Exercises     PT Diagnosis: Difficulty walking;Generalized weakness  PT Problem List: Decreased strength;Decreased activity tolerance;Decreased balance;Decreased  mobility;Obesity;Decreased knowledge of use of DME PT Treatment Interventions: Gait training;Functional mobility training;Therapeutic activities;Therapeutic exercise;Patient/family education;DME instruction;Balance training     PT Goals(Current goals can be found in the care plan section) Acute Rehab PT Goals Patient Stated Goal: pt and wife would like to return home PT Goal Formulation: With patient/family Time For Goal Achievement: 06/25/13 Potential to Achieve Goals: Fair  Visit Information  Last PT Received On: 06/11/13 Assistance Needed: +1 History of Present Illness: 73 yo male admitted with sepsis. 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        Prior [place] expects to be discharged to:: Private residence Living Arrangements: Spouse/significant other Available Help at Discharge: Family Type of Home: House Home Access: Stairs to enter Technical brewer of Steps: 2 Home Layout: One [place]: Environmental consultant - 2 wheels;Cane - single point Prior Function Level of Independence: Needs assistance Gait / Transfers Assistance Needed: pt has been using RW for about the last week or so.  ADL's / Homemaking Assistance Needed: Wife is assisting with sponge bathing Communication Communication: No difficulties    Cognition  Cognition Arousal/Alertness: Awake/alert Behavior During Therapy: WFL for tasks assessed/performed Overall Cognitive Status: Within Functional Limits for tasks assessed    Extremity/Trunk Assessment Upper Extremity Assessment Upper Extremity Assessment: Generalized weakness Lower Extremity Assessment Lower Extremity Assessment: Generalized weakness Cervical / Trunk Assessment Cervical / Trunk Assessment: Normal   Balance    End of Session PT - End of Session Activity Tolerance: Patient limited by fatigue Patient left: in chair;with call bell/phone within reach;with family/visitor present  GP      [name], MPT Pager: [phone]

## 2013-06-11 NOTE — Progress Notes (Signed)
Despite informing the pt that the doctor has ordered him for bedrest, pt insists on getting OOB to chair. Pt assisted with 2 RNs at bedside and lift equipment. Pt transferred with minimal assistance

## 2013-06-12 DIAGNOSIS — R609 Edema, unspecified: Secondary | ICD-10-CM

## 2013-06-12 DIAGNOSIS — R7881 Bacteremia: Secondary | ICD-10-CM

## 2013-06-12 LAB — COMPREHENSIVE METABOLIC PANEL
ALT: 59 U/L — AB (ref 0–53)
AST: 123 U/L — ABNORMAL HIGH (ref 0–37)
Albumin: 1.9 g/dL — ABNORMAL LOW (ref 3.5–5.2)
Alkaline Phosphatase: 592 U/L — ABNORMAL HIGH (ref 39–117)
BUN: 20 mg/dL (ref 6–23)
CALCIUM: 7.2 mg/dL — AB (ref 8.4–10.5)
CO2: 21 mEq/L (ref 19–32)
CREATININE: 1.03 mg/dL (ref 0.50–1.35)
Chloride: 96 mEq/L (ref 96–112)
GFR calc Af Amer: 81 mL/min — ABNORMAL LOW (ref >90)
GFR, EST NON AFRICAN AMERICAN: 69 mL/min — AB (ref >90)
GLUCOSE: 121 mg/dL — AB (ref 70–99)
Potassium: 3.6 mEq/L — ABNORMAL LOW (ref 3.7–5.3)
SODIUM: 131 meq/L — AB (ref 137–147)
TOTAL PROTEIN: 5.5 g/dL — AB (ref 6.0–8.3)
Total Bilirubin: 3.8 mg/dL — ABNORMAL HIGH (ref 0.3–1.2)

## 2013-06-12 LAB — CBC
HCT: 38.9 % — ABNORMAL LOW (ref 39.0–52.0)
Hemoglobin: 13.9 g/dL (ref 13.0–17.0)
MCH: 31.2 pg (ref 26.0–34.0)
MCHC: 35.7 g/dL (ref 30.0–36.0)
MCV: 87.2 fL (ref 78.0–100.0)
Platelets: 193 10*3/uL (ref 150–400)
RBC: 4.46 MIL/uL (ref 4.22–5.81)
RDW: 21.3 % — ABNORMAL HIGH (ref 11.5–15.5)
WBC: 10.4 10*3/uL (ref 4.0–10.5)

## 2013-06-12 LAB — MAGNESIUM: MAGNESIUM: 2.5 mg/dL (ref 1.5–2.5)

## 2013-06-12 MED ORDER — FUROSEMIDE 10 MG/ML IJ SOLN
20.0000 mg | Freq: Every day | INTRAMUSCULAR | Status: DC
  Administered 2013-06-12 – 2013-06-14 (×3): 20 mg via INTRAVENOUS
  Filled 2013-06-12 (×2): qty 2

## 2013-06-12 MED ORDER — POTASSIUM CHLORIDE CRYS ER 20 MEQ PO TBCR
40.0000 meq | EXTENDED_RELEASE_TABLET | Freq: Once | ORAL | Status: AC
  Administered 2013-06-12: 40 meq via ORAL
  Filled 2013-06-12: qty 2

## 2013-06-12 NOTE — Progress Notes (Signed)
Received noticed of 2nd blood culture of gram neg rods in aerobic bottle. Will cont to monitor patient.

## 2013-06-12 NOTE — Progress Notes (Signed)
Clinical Social Work Department BRIEF PSYCHOSOCIAL ASSESSMENT 06/12/2013  Patient:  Donald Blair     Account Number:  [id number]     Admit date:  06/10/2013  Clinical Social Worker:  Renold Genta  Date/Time:  06/12/2013 03:32 PM  Referred by:  Physician  Date Referred:  06/12/2013 Referred for  SNF Placement   Other Referral:   Interview type:  Patient Other interview type:   and wife at bedside    PSYCHOSOCIAL DATA Living Status:  WIFE Admitted from facility:   Level of care:   Primary support name:  Donald Blair (wife) ph#: [phone] Primary support relationship to patient:  SPOUSE Degree of support available:   good    CURRENT CONCERNS Current Concerns  Post-Acute Placement   Other Concerns:    SOCIAL WORK ASSESSMENT / PLAN CSW reviewed PT evaluation recommending SNF vs. Home Health at discharge.   Assessment/plan status:  Information/Referral to [employer] Other assessment/ plan:   Information/referral to community resources:   CSW completed FL2 and faxed information to [facility] - provided bed offers to patient & wife.    PATIENT'S/FAMILY'S RESPONSE TO PLAN OF CARE: Patient was hesitant to agree to SNF - states that he would really prefer to just go home but wife at bedside states that she does not feel like she would be able to take care of him at discharge.    Patient states that if the fluid could just come off his legs he would be able to walk again and feels that he would be ok to just return home, but incase that does not happen - he begrudgingly agreed to a private room at either [facility], [facility] or [facility].    CSW will follow-up tomorrow.       Raynaldo Opitz, [facility] Clinical Social Worker cell #: [phone]

## 2013-06-12 NOTE — Progress Notes (Addendum)
PROGRESS NOTE  Donald Blair [id number] DOB: June 06, 1940 DOA: 06/10/2013 PCP: [name], MD  Assessment/Plan: Sepsis secondary to HCAP-left base - Continue vancomycin and Zosyn. Blood cultures x2: Gram-negative rods-follow final sensitivities. Patient maintaining oxygen saturations in the high 90s on room air. Urine Legionella and streptococcal antigen negative  Gram-negative rod bacteremia - Unclear source. Follow final sensitivities. Continue IV Zosyn.  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 He favors hospice care at this time and getting palliative care team involved  Anxiety - continue Xanax  Questionable right lower extremities cellulitis - continue coverage with antibiotics as above. Significant pedal edema with weeping. Add Lasix.  Liver metastasis - we'll monitor LFTs. Oncology following.  Generalized weakness & FTT - PT consult  Significant bilateral leg edema/anasarca: May be secondary to poor nutritional status and hypoalbuminemia.? Element of CHF but BNP low. IV Lasix. Check 2-D echo.  NSVT x1: Seen on telemetry on 3/16 at 8:50 AM. Check echo. Keep potassium close to 4 and magnesium close to 2. Continue monitoring on telemetry.    Diet: heart Fluids: NS DVT Prophylaxis: Lovenox  Code Status: Full code Family Communication: Wife at the bedside  Disposition Plan: Inpatient  Consultants:  Oncology  Procedures:  None   Antibiotics Vancomycin 3/15>> Zosyn 3/15>>  HPI/Subjective: Denies dyspnea or chest pain. Weeping right leg.  Objective: Filed Vitals:   06/11/13 2105 06/12/13 0520 06/12/13 0930 06/12/13 1440  BP: 126/66 135/68  132/67  Pulse: 110 110 120 112  Temp: 97.5 F (36.4 C) 97.6 F (36.4 C)  98 F (36.7 C)  TempSrc: Oral Oral  Oral  Resp: 18 20  19   Height:       Weight:      SpO2: 98% 97% 99% 99%    Intake/Output Summary (Last 24 hours) at 06/12/13 1603 Last data filed at 06/12/13 1550  Gross per 24 hour  Intake   1670 ml  Output   1125 ml  Net    545 ml   Filed Weights   06/10/13 2154 06/11/13 0100  Weight: 134.7 kg (296 lb 15.4 oz) 135.4 kg (298 lb 8.1 oz)    Exam:   General:  Morbidly obese male, chronically ill-looking sitting up comfortably on chair.  Cardiovascular: regular rate and rhythm, without MRG. Telemetry: Sinus tachycardia in the 100s. A 12 beat NSVT at 8:50 AM on 3/16.  Respiratory: good air movement, clear to auscultation throughout, no wheezing, ronchi or rales  Abdomen: soft, not tender to palpation, positive bowel sounds  MSK: Pitting 2+ bilateral leg edema. Possibly worse erythematous changes on the right lower extremity with weeping.  Neuro: Nonfocal  Data Reviewed: Basic Metabolic Panel:  Recent Labs Lab 06/06/13 1005 06/10/13 2020 06/11/13 1200 06/12/13 0425  NA 132* 129* 130* 131*  K 3.5 3.9 3.6* 3.6*  CL  --  92* 94* 96  CO2 20* 22 24 21   GLUCOSE 128 130* 127* 121*  BUN 14.5 20 20 20   CREATININE 0.8 0.96 1.16 1.03  CALCIUM 8.0* 7.7* 7.3* 7.2*   Liver Function Tests:  Recent Labs Lab 06/06/13 1005 06/10/13 2020 06/11/13 1200 06/12/13 0425  AST 172 Repeated and Verified* 128* 120* 123*  ALT 75* 65* 60* 59*  ALKPHOS 769* 631* 597* 592*  BILITOT 3.00* 4.4* 4.1* 3.8*  PROT 6.1* 6.2 5.7* 5.5*  ALBUMIN 2.3*  2.2* 2.0* 1.9*    Recent Labs Lab 06/10/13 2020  LIPASE 65*   No results found for this basename: AMMONIA,  in the last 168 hours CBC:  Recent Labs Lab 06/06/13 1005 06/10/13 2020 06/11/13 1200 06/12/13 0425  WBC 11.8* 14.2* 11.0* 10.4  NEUTROABS 9.9* 12.3*  --   --   HGB 13.9 13.7 13.1 13.9  HCT 41.6 39.5 38.1* 38.9*  MCV 87.0 84.9 85.8 87.2  PLT 236 230 195 193   Cardiac Enzymes:  Recent Labs Lab 06/06/13 1217  TROPONINI <0.30   BNP (last 3  results)  Recent Labs  06/06/13 1230 06/10/13 2020  PROBNP 69.6 123.5   CBG: No results found for this basename: GLUCAP,  in the last 168 hours  Recent Results (from the past 240 hour(s))  CULTURE, BLOOD (ROUTINE X 2)     Status: None   Collection Time    06/10/13 10:14 PM      Result Value Ref Range Status   Specimen Description BLOOD RIGHT ARM   Final   Special Requests BOTTLES DRAWN AEROBIC AND ANAEROBIC [facility] EACH   Final   Culture  Setup Time     Final   Value: 06/11/2013 03:10     Performed at [facility]   Culture     Final   Value: GRAM NEGATIVE RODS     Note: Gram Stain Report Called to,Read Back By and Verified With: GWEN YATES AT 1:11 A.M. ON 06/12/13 WARRB     Performed at [facility]   Report Status PENDING   Incomplete  CULTURE, BLOOD (ROUTINE X 2)     Status: None   Collection Time    06/10/13 10:29 PM      Result Value Ref Range Status   Specimen Description BLOOD RIGHT HAND   Final   Special Requests BOTTLES DRAWN AEROBIC AND ANAEROBIC 4CC EACH   Final   Culture  Setup Time     Final   Value: 06/11/2013 03:10     Performed at [facility]   Culture     Final   Value: GRAM NEGATIVE RODS     Note: Gram Stain Report Called to,Read Back By and Verified With: GWEN YEATS ON 3.17.2015 AT 12:19A BY WILEJ     Performed at [facility]   Report Status PENDING   Incomplete  CULTURE, EXPECTORATED SPUTUM-ASSESSMENT     Status: None   Collection Time    06/11/13  2:24 AM      Result Value Ref Range Status   Specimen Description SPUTUM   Final   Special Requests NONE   Final   Sputum evaluation     Final   Value: THIS SPECIMEN IS ACCEPTABLE. RESPIRATORY CULTURE REPORT TO FOLLOW.   Report Status 06/11/2013 FINAL   Final  CULTURE, RESPIRATORY (NON-EXPECTORATED)     Status: None   Collection Time    06/11/13  2:24 AM      Result Value Ref Range Status   Specimen Description SPUTUM   Final   Special Requests NONE   Final   Gram Stain      Final   Value: MODERATE WBC PRESENT, PREDOMINANTLY PMN     FEW SQUAMOUS EPITHELIAL CELLS PRESENT     MODERATE GRAM POSITIVE RODS     FEW GRAM POSITIVE COCCI IN PAIRS     FEW YEAST   Culture     Final   Value: NORMAL OROPHARYNGEAL FLORA  Performed at [facility]   Report Status PENDING   Incomplete     Studies: US Abdomen Limited  06/11/2013   CLINICAL DATA:  History of metastatic prostate cancer. Weakness. Possible metastatic disease the liver by CT abdomen and pelvis.  EXAM: US ABDOMEN LIMITED - RIGHT UPPER QUADRANT  COMPARISON:  CT chest, abdomen and pelvis 06/06/2013.  FINDINGS: Gallbladder:  The gallbladder is contracted with stones present. No wall thickening or pericholecystic fluid.  Common bile duct:  Diameter: 0.3 cm.  Liver:  As seen on CT scan, innumerable very large hypoechoic lesions are present in the liver. No intrahepatic biliary ductal dilatation is identified.  IMPRESSION: Multiple of hepatic lesions likely due to metastatic prostate cancer. As noted on report of prior CT abdomen and pelvis, MRI could be used for further evaluation.  Gallstones without evidence cholecystitis.   Electronically Signed   By: Inge Rise M.D.   On: 06/11/2013 00:11   Dg Chest Portable 1 View  06/10/2013   CLINICAL DATA:  Dyspnea  EXAM: PORTABLE CHEST - 1 VIEW  COMPARISON:  Chest radiograph and chest CT June 06, 2013  FINDINGS: There is consolidation in the medial left base. There is underlying emphysematous change. Elsewhere, the lungs are clear. Heart size and pulmonary vascularity are normal. No adenopathy.  IMPRESSION: Medial left base infiltrate.  Underlying emphysema.   Electronically Signed   By: Lowella Grip M.D.   On: 06/10/2013 21:00    Scheduled Meds: . antiseptic oral rinse  15 mL Mouth Rinse BID  . enoxaparin (LOVENOX) injection  40 mg Subcutaneous Q24H  . multivitamin with minerals  1 tablet Oral Daily  . piperacillin-tazobactam (ZOSYN)  IV  3.375 g  Intravenous Q8H  . vancomycin  1,250 mg Intravenous Q12H   Continuous Infusions: . sodium chloride 10 mL/hr at 06/11/13 1949    Principal Problem:   Sepsis Active Problems:   Prostate cancer   Healthcare-associated pneumonia   Cellulitis   Dehydration   Tachycardia   Anxiety   Time spent: 35  This note has been created with Surveyor, quantity. Any transcriptional errors are unintentional.   Vernell Leep, MD, FACP, FHM. [facility] Pager [phone]  If 7PM-7AM, please contact night-coverage [email or web address] Password [email or web address] 06/12/2013, 4:03 PM     LOS: 2 days

## 2013-06-12 NOTE — Progress Notes (Signed)
Physical Therapy Treatment Patient Details Name: Donald Blair MRN: [id number] DOB: 06-May-1940 Today's Date: 06/12/2013 Time: 2111-7356 PT Time Calculation (min): 17 min  PT Assessment / Plan / Recommendation  History of Present Illness 73 yo male admitted with sepsis. 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    PT Comments   Progressing slowly with mobility. Feel pt could benefit from [place] rehab if agreeable however noted pt is declining placement at this time. Will continue to follow during stay. Pt could benefit from nursing ambulating pt at least once/day in addition to pt working with therapy.   Follow Up Recommendations  Home health PT;Supervision/Assistance - 24 hour (SNF could be beneficial however pt is declining placement at this time)     Does the patient have the potential to tolerate intense rehabilitation     Barriers to Discharge        Equipment Recommendations  None recommended by PT    Recommendations for Other Services OT consult  Frequency Min 3X/week   Progress towards PT Goals Progress towards PT goals: Progressing toward goals (slowly)  Plan Current plan remains appropriate    Precautions / Restrictions Precautions Precautions: Fall Restrictions Weight Bearing Restrictions: No   Pertinent Vitals/Pain No c/o pain    Mobility  Bed Mobility General bed mobility comments: pt sitting in recliner Transfers Overall transfer level: Needs assistance Equipment used: Rolling walker (2 wheeled) Transfers: Sit to/from Stand Sit to Stand: Min assist General transfer comment: x2. VCs safety, hand placement. Assist to rise, stabilize, control descent Ambulation/Gait Ambulation/Gait assistance: Min assist Ambulation Distance (Feet): 40 Feet (x2) Assistive device: Rolling walker (2 wheeled) Gait Pattern/deviations: Step-through pattern;Decreased stride length General Gait Details: slow gait speed. fatigues fairly easily. dyspnea 3/4 with  ambulation. followed with recliner. 1 seated rest break needed between walks.     Exercises General Exercises - Lower Extremity Long Arc Quad: AROM;Both;15 reps;Seated   PT Diagnosis:    PT Problem List:   PT Treatment Interventions:     PT Goals (current goals can now be found in the care plan section)    Visit Information  Last PT Received On: 06/12/13 Assistance Needed: +2 (safety/ambulation) History of Present Illness: 73 yo male admitted with sepsis. 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     Subjective Data      Cognition  Cognition Arousal/Alertness: Awake/alert Behavior During Therapy: WFL for tasks assessed/performed Overall Cognitive Status: Within Functional Limits for tasks assessed    Balance  General Comments General comments (skin integrity, edema, etc.): min assist standing balance to perform grooming.  End of Session PT - End of Session Activity Tolerance: Patient limited by fatigue Patient left: in chair;with call bell/phone within reach;with family/visitor present   GP     [name], MPT Pager: [phone]

## 2013-06-12 NOTE — Progress Notes (Signed)
Thank you for consulting the Palliative Medicine Team at [facility] to meet your patient's and family's needs.   The reason that you asked [place] to see your patient is  For Goals of Care, Symptom Management, Care Coordination.  We have scheduled your patient for a meeting: 3/18 at [place], [place]

## 2013-06-12 NOTE — Progress Notes (Signed)
CRITICAL VALUE ALERT  Critical value received:  + gram neg rods in aerobic blood culture  Date of notification:  [id number]  Time of notification:  0040  Critical value read back:yes  Nurse who received alert:  Alanda Slim  MD notified (1st page):  Lamar Blinks, midlevel  Time of first page:  0045  MD notified (2nd page): K.Schorr, midlevel  Time of second page: 0600  Responding MD:  K.Schorr  Time MD responded:  3086 NO orders

## 2013-06-12 NOTE — Progress Notes (Signed)
Occupational Therapy Treatment Patient Details Name: Donald Blair MRN: [id number] DOB: Jul 29, 1940 Today's Date: 06/12/2013 Time: 6244-6950 OT Time Calculation (min): 30 min  OT Assessment / Plan / Recommendation  History of present illness 73 yo male admitted with sepsis. 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    OT comments  Pt up to perform grooming at the sink with fatigued noted and HR 120 afterwards. Pt declining going to SNF and wife states she is willing to help at home. Feel he could benefit from SNF at d/c. Will continue to work toward increased independence with ADL.   Follow Up Recommendations  Supervision/Assistance - 24 hour;Home health OT;SNF (feel pt could benefit from SNF ; however pt declines SNF and if he d/c home, recommend HHOT)    Barriers to Discharge       Equipment Recommendations  3 in 1 bedside comode    Recommendations for Other Services    Frequency Min 2X/week   Progress towards OT Goals Progress towards OT goals: Progressing toward goals  Plan Discharge plan remains appropriate    Precautions / Restrictions Precautions Precautions: Fall Restrictions Weight Bearing Restrictions: No   Pertinent Vitals/Pain No complaint of pain 99% O2 on RA and 120 HR after activity; down to 113 with rest    ADL  Grooming: Performed;Wash/dry hands;Denture care;Minimal assistance Where Assessed - Grooming: Supported standing Lower Body Dressing: Performed;Minimal assistance;Other (comment) (with reacher to doff socks, attempted sock aid to don but needs wide one) Where Assessed - Lower Body Dressing: Unsupported sitting Toilet Transfer: Simulated;Minimal assistance (chair to sink to chair) Equipment Used: Rolling walker;Long-handled shoe horn;Long-handled sponge;Reacher;Sock aid ADL Comments: Educated pt and wife on all AE options for LB dressing. Pt used reacher to doff socks with min assist to get reacher down in the back of the sock.  Attempted to use sock aid but pt needs wide version due to edema in feet. Educated on where to obtain AE. Wife states pt has a reacher, sponge and shoe horn. She states she can help with socks but will keep sock aid in mind. She would like for pt to have a 3in1 and discussed use of 3in1 in shower for shower chair. Pt declines going SNF at this time but does fatigue with activity. HR up to 120 with standing at the sink for 3 minutes. Pt with 2/4 dyspnea but sats 99% on RA. Pt needs min assist to balance and assist to manuever the walker safely.     OT Diagnosis:    OT Problem List:   OT Treatment Interventions:     OT Goals(current goals can now be found in the care plan section)    Visit Information  Last OT Received On: 06/12/13 Assistance Needed: +1 History of Present Illness: 73 yo male admitted with sepsis. 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     Subjective Data      Prior Functioning       Cognition  Cognition Arousal/Alertness: Awake/alert Behavior During Therapy: WFL for tasks assessed/performed Overall Cognitive Status: Within Functional Limits for tasks assessed    Mobility  Bed Mobility General bed mobility comments: pt sitting in recliner Transfers Overall transfer level: Needs assistance Equipment used: Rolling walker (2 wheeled) Transfers: Sit to/from Stand Sit to Stand: Min assist General transfer comment: verbal cues for hand placement and min assist to rise and steady as well as control descent.    Exercises  Balance General Comments General comments (skin integrity, edema, etc.): min assist standing balance to perform grooming.  End of Session OT - End of Session Equipment Utilized During Treatment: Rolling walker Activity Tolerance: Patient limited by fatigue Patient left: in chair;with call bell/phone within reach;with family/visitor present  [place], [place] [phone] 06/12/2013, 10:30 AM

## 2013-06-12 NOTE — Progress Notes (Signed)
Clinical Social Work Department CLINICAL SOCIAL WORK PLACEMENT NOTE 06/12/2013  Patient:  Donald Blair, Donald Blair  Account Number:  [id number] Admit date:  06/10/2013  Clinical Social Worker:  Renold Genta  Date/time:  06/12/2013 03:36 PM  Clinical Social Work is seeking post-discharge placement for this patient at the following level of care:   SKILLED NURSING   (*CSW will update this form in Epic as items are completed)   06/12/2013  Patient/family provided with [place] Department of Clinical Social Work's list of facilities offering this level of care within the geographic area requested by the patient (or if unable, by the patient's family).  06/12/2013  Patient/family informed of their freedom to choose among providers that offer the needed level of care, that participate in Medicare, Medicaid or managed care program needed by the patient, have an available bed and are willing to accept the patient.  06/12/2013  Patient/family informed of [facility]' ownership interest in [facility], as well as of the fact that they are under no obligation to receive care at this facility.  PASARR submitted to EDS on 06/12/2013 PASARR number received from EDS on 06/12/2013  FL2 transmitted to all facilities in geographic area requested by pt/family on  06/12/2013 FL2 transmitted to all facilities within larger geographic area on   Patient informed that his/her managed care company has contracts with or will negotiate with  certain facilities, including the following:     Patient/family informed of bed offers received:  06/12/2013 Patient chooses bed at  Physician recommends and patient chooses bed at    Patient to be transferred to  on   Patient to be transferred to facility by   The following physician request were entered in Epic:   Additional Comments:   Raynaldo Opitz, [place] Social Worker cell #: [phone]

## 2013-06-13 ENCOUNTER — Inpatient Hospital Stay (HOSPITAL_COMMUNITY): Payer: Medicare Other

## 2013-06-13 ENCOUNTER — Ambulatory Visit: Payer: Medicare Other | Admitting: Radiation Oncology

## 2013-06-13 DIAGNOSIS — I509 Heart failure, unspecified: Secondary | ICD-10-CM

## 2013-06-13 DIAGNOSIS — Z515 Encounter for palliative care: Secondary | ICD-10-CM

## 2013-06-13 DIAGNOSIS — Z66 Do not resuscitate: Secondary | ICD-10-CM

## 2013-06-13 LAB — CULTURE, RESPIRATORY W GRAM STAIN

## 2013-06-13 LAB — BASIC METABOLIC PANEL
BUN: 20 mg/dL (ref 6–23)
CO2: 22 mEq/L (ref 19–32)
Calcium: 7.3 mg/dL — ABNORMAL LOW (ref 8.4–10.5)
Chloride: 96 mEq/L (ref 96–112)
Creatinine, Ser: 1.08 mg/dL (ref 0.50–1.35)
GFR, EST AFRICAN AMERICAN: 76 mL/min — AB (ref >90)
GFR, EST NON AFRICAN AMERICAN: 66 mL/min — AB (ref >90)
Glucose, Bld: 117 mg/dL — ABNORMAL HIGH (ref 70–99)
POTASSIUM: 3.7 meq/L (ref 3.7–5.3)
SODIUM: 131 meq/L — AB (ref 137–147)

## 2013-06-13 LAB — VANCOMYCIN, TROUGH: VANCOMYCIN TR: 21.7 ug/mL — AB (ref 10.0–20.0)

## 2013-06-13 MED ORDER — POTASSIUM CHLORIDE CRYS ER 20 MEQ PO TBCR
40.0000 meq | EXTENDED_RELEASE_TABLET | Freq: Every day | ORAL | Status: DC
  Administered 2013-06-13 – 2013-06-15 (×3): 40 meq via ORAL
  Filled 2013-06-13 (×3): qty 2

## 2013-06-13 MED ORDER — VANCOMYCIN HCL 1000 MG IV SOLR
1000.0000 mg | Freq: Two times a day (BID) | INTRAVENOUS | Status: DC
  Administered 2013-06-13 – 2013-06-14 (×2): 1000 mg via INTRAVENOUS
  Filled 2013-06-13 (×3): qty 1000

## 2013-06-13 NOTE — Consult Note (Addendum)
Patient BA:QVOHCSPZ KEDARIUS ALOISI      DOB: 1940-06-02      ZCK:[id number]  Summary of Goals; full note to follow:  Met with patient Delfino Lovett, his spouse Bethena Roys, Daughter Juliann Pulse, and son  Leroy Sea  Family coming to terms with Celesta Aver diagnosis and news that treatment is dependent on performance and at this time he is not able to tolerate treatment.  Family would like to care for him at home but feel he is too weak.  Richard states that if he is dying that he wants to do that at home.  Bethena Roys reports patient has significant privacy issues ans is claustrophobic to the point of needing medication to ride and elevator.  Patient reports he would like to try rehab to get stronger so he can be at home as much as possible .  We talked about his code status he told his family he would desire to be a DNR instead of come back in the "shape he is in".  Family was able to openly and honestly talk about fears, concerns and able to cry together.  Chaplain assisted in [place] conversations about hard choice.  Richard also told family he is not hurting at present although there are times where he appears to be hurting.  He expressed his disappointment over not responding to the clinical trials that he has been in.  Recommend:  1.  DNR  With be put in place  2.  Continue to treat the treatable and optimize his function for rehab transfer with either palliative care services or hospice if he can have from day one.  3.  Continue lasix and antibiotics.  4.  Continue PT/ OT  5.  Legs weeping: elevate and cleanse frequently, hyperpigmentation may not necessarily represent cellulitis may just be from edema   Discussed with Dr. Algis Liming  Time 400 - 520 pm  Amra Shukla L. Lovena Le, MD MBA The Palliative Medicine Team at [facility] Phone: [phone] Pager: [phone]

## 2013-06-13 NOTE — Progress Notes (Signed)
Pharmacy - Vancomycin  Assessment: Vanc trough reported as 21.7mg /l, slightly above the target range 15-20mg /l.   Plan: Reduce Vanc from 1250mg  q12h to 1000mg  q12h. Cont to follow daily and recheck vanc trough when appropriate.   Romeo Rabon, PharmD, pager [phone]. 06/13/2013,4:36 PM.

## 2013-06-13 NOTE — Plan of Care (Signed)
Problem: Acute Rehab OT Goals (only OT should resolve) Goal: Pt. Will Perform Lower Body Dressing Outcome: Not Applicable Date Met:  57/97/28 Pt's wife states she will assist with LB dressing. They do not wish to pursue AE use further.

## 2013-06-13 NOTE — Progress Notes (Signed)
IP PROGRESS NOTE  Subjective:   Patient is feeling better sitting in a chair needing breakfast. He is not in any distress.  Objective:  Vital signs in last 24 hours: Temp:  [97.3 F (36.3 C)-98 F (36.7 C)] 97.3 F (36.3 C) (03/18 0526) Pulse Rate:  [107-120] 107 (03/18 0526) Resp:  [18-20] 20 (03/18 0526) BP: (113-135)/(67-91) 113/73 mmHg (03/18 0526) SpO2:  [99 %-100 %] 100 % (03/18 0526) Weight change:  Last BM Date: 06/10/13  Intake/Output from previous day: 03/17 0701 - 03/18 0700 In: 2109.5 [P.O.:720; I.V.:189.5; IV Piggyback:1200] Out: 1075 [Urine:1075]  Mouth: mucous membranes moist, pharynx normal without lesions Resp: rales bilaterally Cardio: regular rate and rhythm, S1, S2 normal, no murmur, click, rub or gallop GI: soft, non-tender; bowel sounds normal; no masses,  no organomegaly Extremities: edema 2+ bilaterally    Lab Results:  Recent Labs  06/11/13 1200 06/12/13 0425  WBC 11.0* 10.4  HGB 13.1 13.9  HCT 38.1* 38.9*  PLT 195 193    BMET  Recent Labs  06/12/13 0425 06/13/13 0345  NA 131* 131*  K 3.6* 3.7  CL 96 96  CO2 21 22  GLUCOSE 121* 117*  BUN 20 20  CREATININE 1.03 1.08  CALCIUM 7.2* 7.3*    Studies/Results: No results found.  Medications: I have reviewed the patient's current medications.  Assessment/Plan:   73 year old gentleman with the following issues:  1. Advanced prostate cancer with metastatic disease to the bone and now possibly to the liver. His last PSA had dramatically increased despite being on Xtandi. I feel that his cancer is progressing rather rapidly could also be contributing to his failure to thrive. Despite his clinical improvement in the last few days though believes his overall prognosis is rather poor for prostate cancer and I feel that he will benefit is a mild generally from systemic chemotherapy. I shared with him my opinion regarding history main option today and I favor supportive care and hospice  care only at this time. I am very skeptical about his ability to handle systemic chemotherapy I also question the benefit which would be marginal at best given his hepatic metastasis of prostate cancer.  2. Weakness and failure to thrive: Possibly related to a healthcare associated pneumonia possibly congestive heart failure. I agree with the current management I appreciate the care of by the hospitalist team.  3. Prognosis: His prognosis is relatively poor especially in the setting of advanced prostate cancer and possible liver metastasis. I favor a DO NOT RESUSCITATE CODE STATUS and I agree with Palliative Medicine Team consult. I appreciate your input and help in this gentleman.     LOS: 3 days   [facility] 06/13/2013, 7:48 AM

## 2013-06-13 NOTE — Progress Notes (Signed)
ANTIBIOTIC CONSULT NOTE - FOLLOW UP  Pharmacy Consult for Vancomycin and Zosyn Indication: sepsis due to HCAP  No Known Allergies  Patient Measurements: Height: 5\' 11"  (180.3 cm) Weight: 298 lb 8.1 oz (135.4 kg) IBW/kg (Calculated) : 75.3  Vital Signs: Temp: 97.3 F (36.3 C) (03/18 0526) Temp src: Oral (03/18 0526) BP: 113/73 mmHg (03/18 0526) Pulse Rate: 107 (03/18 0526) Intake/Output from previous day: 03/17 0701 - 03/18 0700 In: 2109.5 [P.O.:720; I.V.:189.5; IV Piggyback:1200] Out: 1075 [Urine:1075] Intake/Output from this shift: Total I/O In: 120 [P.O.:120] Out: -   Labs:  Recent Labs  06/10/13 2020 06/11/13 1200 06/12/13 0425 06/13/13 0345  WBC 14.2* 11.0* 10.4  --   HGB 13.7 13.1 13.9  --   PLT 230 195 193  --   CREATININE 0.96 1.16 1.03 1.08   Estimated Creatinine Clearance: 84.3 ml/min (by C-G formula based on Cr of 1.08). No results found for this basename: Letta Median, [place], GENTTROUGH, GENTPEAK, GENTRANDOM, TOBRATROUGH, TOBRAPEAK, TOBRARND, AMIKACINPEAK, AMIKACINTROU, AMIKACIN,  in the last 72 hours   Microbiology: 3/15 blood: 2 of 2 GNR 3/16 sputum: normal flora 3//16 flu: neg 3/16 urine strep: neg, legionella: neg  Assessment: 27 yoM with advanced prostate cancer with metastatic disease to bone and possibly liver admitted with sepsis secondary to HCAP.  Started on Vancomycin and Zosyn.  Day #4 Vancomycin 2g IV x 1 then 1250 mg IV q12h and Zosyn 3.375g IV q8h (extended infusion).  Afebrile  WBC improved  Renal stable.  SCr 1.08, CrCl~ 60 ml/min (normalized), >100 ml/min (CG)  Blood cultures growing GNR, unknown source.  Goal of Therapy:  Vancomycin trough level 15-20 mcg/ml Eradication of infection  Plan:  1.  Check vancomycin trough today. 2.  Continue Zosyn 3.375g IV q8h (4 hour infusion time). 3.  F/u culture results.  Hershal Coria 06/13/2013,11:20 AM

## 2013-06-13 NOTE — Progress Notes (Signed)
PROGRESS NOTE  Donald Blair [id number] DOB: [birth year] DOA: 06/10/2013 PCP: [name], MD  Assessment/Plan: Sepsis secondary to HCAP-left base - Continue vancomycin and Zosyn. Blood cultures x2: Gram-negative rods-follow final sensitivities-still pending. Patient maintaining oxygen saturations in the high 90s on room air. Urine Legionella and streptococcal antigen negative  Gram-negative rod bacteremia - Unclear source. Follow final sensitivities. Continue IV Zosyn.  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 He favors hospice care at this time and getting palliative care team involved. Palliative care consulted and discussed with Dr. Gaylan Gerold to DO NOT RESUSCITATE. Patient and family would like to treat treatable conditions.  Anxiety - continue Xanax  Questionable right lower extremities cellulitis - continue coverage with antibiotics as above. Significant pedal edema with weeping. Add Lasix-diuresing and improving.  Liver metastasis - we'll monitor LFTs. Oncology following.  Generalized weakness & FTT - PT consult  Significant bilateral leg edema/anasarca: May be secondary to poor nutritional status and hypoalbuminemia.? Element of CHF but BNP low. IV Lasix. Check 2-D echo: LVEF 50-55%.  NSVT x1: Seen on telemetry on 3/16 at 8:50 AM. Check echo. Keep potassium close to 4 and magnesium close to 2. Continue monitoring on telemetry. No further events on monitor.    Diet: heart Fluids: NS DVT Prophylaxis: Lovenox  Code Status: DO NOT RESUSCITATE Family Communication: Wife and extended family at the bedside  Disposition Plan: Inpatient  Consultants:  Oncology  Palliative care team  Procedures:  None   Antibiotics Vancomycin 3/15>> Zosyn 3/15>>  HPI/Subjective: Leg swelling and weeping  decreasing. Denies dyspnea.  Objective: Filed Vitals:   06/12/13 1440 06/12/13 2055 06/13/13 0526 06/13/13 1440  BP: 132/67 135/91 113/73 118/95  Pulse: 112 112 107 114  Temp: 98 F (36.7 C) 97.4 F (36.3 C) 97.3 F (36.3 C) 97.4 F (36.3 C)  TempSrc: Oral Oral Oral Oral  Resp: 19 18 20 22   Height:      Weight:      SpO2: 99% 99% 100% 98%    Intake/Output Summary (Last 24 hours) at 06/13/13 1831 Last data filed at 06/13/13 1323  Gross per 24 hour  Intake    760 ml  Output    500 ml  Net    260 ml   Filed Weights   06/10/13 2154 06/11/13 0100  Weight: 134.7 kg (296 lb 15.4 oz) 135.4 kg (298 lb 8.1 oz)    Exam:   General:  Morbidly obese male, chronically ill-looking sitting up comfortably on chair.  Cardiovascular: regular rate and rhythm, without MRG. Telemetry: Sinus tachycardia in the 100s.   Respiratory: good air movement, clear to auscultation throughout, no wheezing, ronchi or rales  Abdomen: soft, not tender to palpation, positive bowel sounds  MSK: Pitting 2+ bilateral leg edema. Possibly worse erythematous changes on the right lower extremity with weeping-improving.  Neuro: Nonfocal  Data Reviewed: Basic Metabolic Panel:  Recent Labs Lab 06/10/13 2020 06/11/13 1200 06/12/13 0425 06/13/13 0345  NA 129* 130* 131* 131*  K 3.9 3.6* 3.6* 3.7  CL 92* 94* 96 96  CO2 22 24 21 22   GLUCOSE 130* 127* 121* 117*  BUN 20 20 20 20   CREATININE 0.96 1.16 1.03 1.08  CALCIUM 7.7* 7.3* 7.2* 7.3*  MG  --   --  2.5  --    Liver Function Tests:  Recent Labs  Lab 06/10/13 2020 06/11/13 1200 06/12/13 0425  AST 128* 120* 123*  ALT 65* 60* 59*  ALKPHOS 631* 597* 592*  BILITOT 4.4* 4.1* 3.8*  PROT 6.2 5.7* 5.5*  ALBUMIN 2.2* 2.0* 1.9*    Recent Labs Lab 06/10/13 2020  LIPASE 65*   No results found for this basename: AMMONIA,  in the last 168 hours CBC:  Recent Labs Lab 06/10/13 2020 06/11/13 1200 06/12/13 0425  WBC 14.2* 11.0* 10.4  NEUTROABS  12.3*  --   --   HGB 13.7 13.1 13.9  HCT 39.5 38.1* 38.9*  MCV 84.9 85.8 87.2  PLT 230 195 193   Cardiac Enzymes: No results found for this basename: CKTOTAL, CKMB, CKMBINDEX, TROPONINI,  in the last 168 hours BNP (last 3 results)  Recent Labs  06/06/13 1230 06/10/13 2020  PROBNP 69.6 123.5   CBG: No results found for this basename: GLUCAP,  in the last 168 hours  Recent Results (from the past 240 hour(s))  CULTURE, BLOOD (ROUTINE X 2)     Status: None   Collection Time    06/10/13 10:14 PM      Result Value Ref Range Status   Specimen Description BLOOD RIGHT ARM   Final   Special Requests BOTTLES DRAWN AEROBIC AND ANAEROBIC [facility] EACH   Final   Culture  Setup Time     Final   Value: 06/11/2013 03:10     Performed at [facility]   Culture     Final   Value: GRAM NEGATIVE RODS     Note: Gram Stain Report Called to,Read Back By and Verified With: GWEN YATES AT 1:11 A.M. ON 06/12/13 WARRB     Performed at [facility]   Report Status PENDING   Incomplete  CULTURE, BLOOD (ROUTINE X 2)     Status: None   Collection Time    06/10/13 10:29 PM      Result Value Ref Range Status   Specimen Description BLOOD RIGHT HAND   Final   Special Requests BOTTLES DRAWN AEROBIC AND ANAEROBIC 4CC EACH   Final   Culture  Setup Time     Final   Value: 06/11/2013 03:10     Performed at [facility]   Culture     Final   Value: GRAM NEGATIVE RODS     Note: Gram Stain Report Called to,Read Back By and Verified With: GWEN YEATS ON 3.17.2015 AT 12:19A BY WILEJ     Performed at [facility]   Report Status PENDING   Incomplete  CULTURE, EXPECTORATED SPUTUM-ASSESSMENT     Status: None   Collection Time    06/11/13  2:24 AM      Result Value Ref Range Status   Specimen Description SPUTUM   Final   Special Requests NONE   Final   Sputum evaluation     Final   Value: THIS SPECIMEN IS ACCEPTABLE. RESPIRATORY CULTURE REPORT TO FOLLOW.   Report Status 06/11/2013 FINAL    Final  CULTURE, RESPIRATORY (NON-EXPECTORATED)     Status: None   Collection Time    06/11/13  2:24 AM      Result Value Ref Range Status   Specimen Description SPUTUM   Final   Special Requests NONE   Final   Gram Stain     Final   Value: MODERATE WBC PRESENT, PREDOMINANTLY PMN     FEW SQUAMOUS EPITHELIAL CELLS PRESENT     MODERATE GRAM POSITIVE RODS  FEW GRAM POSITIVE COCCI IN PAIRS     FEW YEAST   Culture     Final   Value: NORMAL OROPHARYNGEAL FLORA     Performed at [facility]   Report Status 06/13/2013 FINAL   Final     Studies: Dg Chest Port 1 View  06/13/2013   CLINICAL DATA:  HCAP  EXAM: PORTABLE CHEST - 1 VIEW  COMPARISON:  DG CHEST 1V PORT dated 06/10/2013  FINDINGS: Low lung volumes. Persistent increased density projects in the left lung base levels blunting of the left costophrenic angle. Cardiac silhouette is poorly visualized. Atherosclerotic calcifications identified within the aorta. The osseous structures are unremarkable.  IMPRESSION: Atelectasis versus infiltrate left lung base post osseous small left effusion. Repeat surveillance evaluation with PA and lateral views of the chest is recommended.   Electronically Signed   By: Margaree Mackintosh M.D.   On: 06/13/2013 12:42    Scheduled Meds: . antiseptic oral rinse  15 mL Mouth Rinse BID  . enoxaparin (LOVENOX) injection  40 mg Subcutaneous Q24H  . furosemide  20 mg Intravenous Daily  . multivitamin with minerals  1 tablet Oral Daily  . piperacillin-tazobactam (ZOSYN)  IV  3.375 g Intravenous Q8H  . vancomycin (VANCOCIN) 1000 mg IVPB  1,000 mg Intravenous Q12H   Continuous Infusions: . sodium chloride 10 mL/hr at 06/11/13 1949    Principal Problem:   Sepsis Active Problems:   Prostate cancer   Healthcare-associated pneumonia   Cellulitis   Dehydration   Tachycardia   Anxiety   Time spent: 35  This note has been created with Surveyor, quantity. Any  transcriptional errors are unintentional.   Vernell Leep, MD, FACP, FHM. [facility] Pager [phone]  If 7PM-7AM, please contact night-coverage [email or web address] Password [facility] 06/13/2013, 6:31 PM     LOS: 3 days

## 2013-06-13 NOTE — Consult Note (Signed)
Patient OZ:[id number] Donald Blair      DOB: 1941/02/22      VQQ:[id number]     Consult Note from the Palliative Medicine Team at [place] Requested by: Dr. Algis Blair     PCP: Donald Coffin, MD Reason for Consultation: Donald Blair     Phone Number:[phone] Related symptoms Assessment of patients Current state: 73 yr old white male with a history of known prostate cancer, s/p several trials and courses of chemotherapy.  The patient was seen in the office last Friday and referred to the ER.  Patient was discharged to home due to diagnosis of generalized weakness and no other obvious reason for admission.  He continue to decline and was returned to the ER on Sunday.  He was admitted with edema of the legs and abdomen, sepsis and pneumonia.  Patient is slowly improving but still doing very poorly.  Dr. Alen Blew updated he and his wife on fact that he is too weak to have chemo at present , but that if he improved they could re-evaulate.  The patient is visibly failing- falls asleep at the drop of the hat, too weak to assist with ADLs.  She my note from day of service.  Patient and family talked through where they are at in Donald Blair's illness.  They feel he has been declining and realize that he may not improve.  We talked about rehab, vs hospice care. At this time the patient and family would like to optimize his current treatment with the goal to try to do rehab and get a little stronger to be able to come home.  They are open to hospice care ultimately but want to see if he can get home as this is his wish. We talked about his code status.  He reports he would not wish to be resuscitated.  Family is supporting this.   Goals of Care: 1.  Code Status: DNR initiated.   2. Scope of Treatment: continue to treat infection with the hope to stabilize and strengthen condition so that he can go home.    3.   Disposition: SNF with Palliative Care Services to follow if he can not have Hospice from day  one.   4. Symptom Management:   1. Anxiety/Agitation: continue prn ativan.  Please premedicate when patient needs to travel on an elevator as he is severely claustrophobic. 2. Pain: as needed medication.  Patient reports no pain at this time. Could use MSIR 5 mg q 2-4 hours prn.  3. Bowel Regimen: currently having frequent soft stools.  If persists consider checking cdiff.  On no other agents  5. Psychosocial: Married for 55 yrs.  Helped his wife take care of her mother for all that time.  Two children and multiple grandchildren.  6. Spiritual:  Family of faith, chaplain in our meeting supporting         Patient Documents Completed or Given: Document Given Completed  Advanced Directives Pkt    MOST    DNR    Gone from My Sight    Hard Choices      Brief HPI: 73 yr old with known prostate cancer, widely metastatic now involving liver.  We were asked to assist with goals of care.    ROS: he denies pain, shortness of breath, nausea, vomiting.  He does endorse weakness,    PMH:  Past Medical History  Diagnosis Date  . Cancer     prostate  . Hyperlipidemia   . Wears  glasses   . Shortness of breath   . Arthritis   . 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 Lymphadenopathy, supraclavicular     left, prostate primary  . Prostate cancer 2005    xrt w/seed implant  . Hx of radiation therapy 04/18/13- 05/07/13    left neck/upper mediastinum 3500 cGy in 14 sessions  . Pneumonia      PSH: Past Surgical History  Procedure Laterality Date  . Tonsillectomy    . Hernia repair  1972    lt ing  . Knee arthroscopy  1999    right  . Knee arthroscopy  2001    left  . Transperineal implant of radiation seeds w/ ultrasound  2005    prostate  . Shoulder arthroscopy with rotator cuff repair Right 10/03/2012    Procedure: RIGHT SHOULDER ARTHROSCOPY WITH OPEN ROTATOR CUFF REPAIR AND DISTAL CLAVICLE EXCISION;  Surgeon: Cammie Sickle., MD;  Location: [facility];  Service: Orthopedics;  Laterality: Right;   I have reviewed the [place] and SH and  If appropriate update it with new information. No Known Allergies Scheduled Meds: . antiseptic oral rinse  15 mL Mouth Rinse BID  . enoxaparin (LOVENOX) injection  40 mg Subcutaneous Q24H  . furosemide  20 mg Intravenous Daily  . multivitamin with minerals  1 tablet Oral Daily  . piperacillin-tazobactam (ZOSYN)  IV  3.375 g Intravenous Q8H  . vancomycin (VANCOCIN) 1000 mg IVPB  1,000 mg Intravenous Q12H   Continuous Infusions: . sodium chloride 10 mL/hr at 06/11/13 1949   PRN Meds:.ALPRAZolam, ondansetron    BP 118/95  Pulse 114  Temp(Src) 97.4 F (36.3 C) (Oral)  Resp 22  Ht 5\' 11"  (1.803 m)  Wt 135.4 kg (298 lb 8.1 oz)  BMI 41.65 kg/m2  SpO2 98%   PPS: 30%   Intake/Output Summary (Last 24 hours) at 06/13/13 1809 Last data filed at 06/13/13 1323  Gross per 24 hour  Intake    760 ml  Output    500 ml  Net    260 ml   LBM: 8 x                     Physical Exam:  General: Somnolent but eventually able to participate and stay awake HEENT:  PERRL, EOMI, anciteric , mm dry Chest:   Decreased poor air entry CVS: tachy, S2, S2 distant, no murmur rub or gallop Abdomen:obese soft, not tender, or distended Ext: hyperpigmented weeping , ?cellulitis vs pigmented Neuro:somnolent but rousable and ultimately able to participate.  Labs: CBC     CMP     Component Value Date/Time   NA 131* 06/13/2013 0345   NA 132* 06/06/2013 1005   K 3.7 06/13/2013 0345   K 3.5 06/06/2013 1005   CL 96 06/13/2013 0345   CO2 22 06/13/2013 0345   CO2 20* 06/06/2013 1005   GLUCOSE 117* 06/13/2013 0345   GLUCOSE 128 06/06/2013 1005   BUN 20 06/13/2013 0345   BUN 14.5 06/06/2013 1005   CREATININE 1.08 06/13/2013 0345   CREATININE 0.8 06/06/2013 1005   CALCIUM 7.3* 06/13/2013 0345   CALCIUM 8.0* 06/06/2013 1005   PROT 5.5* 06/12/2013 0425   PROT 6.1* 06/06/2013 1005   ALBUMIN 1.9* 06/12/2013 0425    ALBUMIN 2.3* 06/06/2013 1005   AST 123* 06/12/2013 0425   AST 172 Repeated and Verified* 06/06/2013 1005   ALT 59* 06/12/2013 0425  ALT 75* 06/06/2013 1005   ALKPHOS 592* 06/12/2013 0425   ALKPHOS 769* 06/06/2013 1005   BILITOT 3.8* 06/12/2013 0425   BILITOT 3.00* 06/06/2013 1005   GFRNONAA 66* 06/13/2013 0345   GFRAA 76* 06/13/2013 0345    Chest Xray Reviewed/Impressions:  Atelectasis versus infiltrate left lung base post osseous small left  effusion. Repeat surveillance evaluation with PA and lateral views  of the chest is recommended.    CT scan of the Abd/Pelvis Reviewed/Impressions: 1. Negative for pulmonary embolism or other acute intrathoracic  disease.  2. Mixed response to therapy. While periaortic and left  supraclavicular lymphadenopathy has decreased in size, there is new  adenopathy in the left axilla and an enlarging right adrenal gland  metastasis.  3. Since 11/14/2012, new diffuse hepatic abnormality which could  represent extensive infiltrative tumor or patchy steatosis. MRI  could differentiate if clinically warranted. There is new ascites,  small volume, which is presumably related.      Time In Time Out Total Time Spent with Patient Total Overall Time  400 pm 520 pm 80 min 80 min    Greater than 50%  of this time was spent counseling and coordinating care related to the above assessment and plan.   Larri Brewton L. Lovena Le, MD MBA The Palliative Medicine Team at [facility] Phone: [phone] Pager: [phone]

## 2013-06-13 NOTE — Progress Notes (Signed)
Physical Therapy Treatment Patient Details Name: Donald Blair MRN: [id number] DOB: 12/19/1940 Today's Date: 06/13/2013 Time: 1111-1147 PT Time Calculation (min): 36 min  PT Assessment / Plan / Recommendation  History of Present Illness 72 yo male admitted with sepsis. 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    PT Comments   Progressing slowly with mobility. Continues to fatigue easily.   Follow Up Recommendations  SNF (HHPT, 24/7 supervision/assist if pt declines placement)     Does the patient have the potential to tolerate intense rehabilitation     Barriers to Discharge        Equipment Recommendations  None recommended by PT    Recommendations for Other Services OT consult  Frequency Min 3X/week   Progress towards PT Goals Progress towards PT goals: Progressing toward goals (slowly)  Plan Current plan remains appropriate    Precautions / Restrictions Precautions Precautions: Fall Restrictions Weight Bearing Restrictions: No   Pertinent Vitals/Pain No c/o pain    Mobility  Bed Mobility General bed mobility comments: pt sitting in recliner Transfers Overall transfer level: Needs assistance Equipment used: Rolling walker (2 wheeled) Transfers: Sit to/from Stand Sit to Stand: Min assist Stand pivot transfers: Min assist General transfer comment: VC for safety, hand placement. Assist to rise, stabilize, control descent Ambulation/Gait Ambulation/Gait assistance: Min assist Ambulation Distance (Feet): 50 Feet (x2) Assistive device: Rolling walker (2 wheeled) Gait Pattern/deviations: Decreased step length - right;Decreased step length - left;Decreased stride length General Gait Details: slow gait speed. fatigues easily. dyspnea 3/4 with ambulation. 1 seated rest break needed between walks.     Exercises     PT Diagnosis:    PT Problem List:   PT Treatment Interventions:     PT Goals (current goals can now be found in the care plan  section)    Visit Information  Last PT Received On: 06/13/13 Assistance Needed: +1 History of Present Illness: 72 yo male admitted with sepsis. 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     Subjective Data      Cognition  Cognition Arousal/Alertness: Awake/alert Behavior During Therapy: WFL for tasks assessed/performed Overall Cognitive Status: Within Functional Limits for tasks assessed    Balance     End of Session PT - End of Session Equipment Utilized During Treatment: Gait belt Activity Tolerance: Patient limited by fatigue Patient left: in chair;with call bell/phone within reach;with family/visitor present Nurse Communication: Mobility status   GP     Jannie Porter, MPT Pager: [phone]   

## 2013-06-13 NOTE — Progress Notes (Signed)
  Echocardiogram 2D Echocardiogram has been performed.  Donald Blair 06/13/2013, 10:23 AM

## 2013-06-13 NOTE — Progress Notes (Signed)
06/13/13 1700  Clinical Encounter Type  Visited With Patient and family together;Health care provider (wife, dtr, son; Dr Geni Bers for [facility])  Visit Type Spiritual support;Social support (Goals of Care)  Referral From Palliative care team  Recommendations [facility] will follow for support, but please page as needed:  [phone].  Spiritual Encounters  Spiritual Needs Emotional;Grief support;Prayer  Stress Factors  Patient Stress Factors Loss of control;Major life changes;Health changes  Family Stress Factors Loss of control;Major life changes   Note:  Pt goes by "Richard," his middle name.   Met family to introduce Spiritual Care and chaplain availability prior to [facility] meeting.  Helped family identify and articulate goals, hopes, strengths, and sources of grief as they process Richard's decline and next steps for his care. Particularly named and validated their feelings of grief and loss, as well as their ways of communicating love and support for each other and of practicing self-care, as well.  After [place], gave family space for some privacy and then returned to check in; prayed with family per request at that time.  [place] will continue to follow, but please also page as needed/desired:  [phone].  Thank you.  [place], [place]

## 2013-06-13 NOTE — Progress Notes (Signed)
Occupational Therapy Treatment Patient Details Name: Donald Blair MRN: [id number] DOB: [birth year] Today's Date: 06/13/2013 Time: 4158-3094 OT Time Calculation (min): 13 min  OT Assessment / Plan / Recommendation  History of present illness 73 yo male admitted with sepsis. 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    OT comments  Pt limited by fatigue with basic ADL. Palliative care meeting set for today. Will follow up after Palliative meeting and follow up for OT as appropriate/indicated.   Follow Up Recommendations  Supervision/Assistance - 24 hour;Home health OT;SNF    Barriers to Discharge       Equipment Recommendations  3 in 1 bedside comode    Recommendations for Other Services    Frequency Min 2X/week   Progress towards OT Goals Progress towards OT goals:  (same level today)  Plan Discharge plan remains appropriate    Precautions / Restrictions Precautions Precautions: Fall Restrictions Weight Bearing Restrictions: No   Pertinent Vitals/Pain No complaint of    ADL  Grooming: Performed;Wash/dry hands;Wash/dry face;Denture care;Minimal assistance Where Assessed - Grooming: Supported standing Toilet Transfer: Simulated;Minimal assistance (chair to step around to sink to chair again) Equipment Used: Rolling walker ADL Comments: Palliative Care goals of care meeting set for today. Pt did get up to the sink with the walker to perform grooming tasks. With minimal time standing (less than a minute) pt starting to fatigue and more heavily relying on sink for support. Wife present for session. Pt needing mod cues for safety with the walker as he tends to not stay close to the walker particularly with backing up to the chair, he tends to leave the walker ahead of him. Pt declined need to use 3in1 right now.    OT Diagnosis:    OT Problem List:   OT Treatment Interventions:     OT Goals(current goals can now be found in the care plan section)     Visit Information  Last OT Received On: 06/13/13 Assistance Needed: +1 (short distance to sink) History of Present Illness: 73 yo male admitted with sepsis. 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     Subjective Data      Prior Functioning       Cognition  Cognition Arousal/Alertness: Awake/alert Behavior During Therapy: WFL for tasks assessed/performed Overall Cognitive Status: Within Functional Limits for tasks assessed    Mobility  Bed Mobility General bed mobility comments: pt sitting in recliner Transfers Overall transfer level: Needs assistance Equipment used: Rolling walker (2 wheeled) Transfers: Sit to/from Stand Sit to Stand: Min assist Stand pivot transfers: Min assist General transfer comment: verbal cues for hand placement, assist to steady with standing and control descent.    Exercises      Balance    End of Session OT - End of Session Equipment Utilized During Treatment: Gait belt;Rolling walker Activity Tolerance: Patient limited by fatigue Patient left: in chair;with call bell/phone within reach;with family/visitor present  GO     Jules Schick [phone] 06/13/2013, 10:40 AM

## 2013-06-14 LAB — BASIC METABOLIC PANEL
BUN: 21 mg/dL (ref 6–23)
CALCIUM: 7.1 mg/dL — AB (ref 8.4–10.5)
CO2: 23 mEq/L (ref 19–32)
Chloride: 97 mEq/L (ref 96–112)
Creatinine, Ser: 1.18 mg/dL (ref 0.50–1.35)
GFR, EST AFRICAN AMERICAN: 68 mL/min — AB (ref >90)
GFR, EST NON AFRICAN AMERICAN: 59 mL/min — AB (ref >90)
Glucose, Bld: 103 mg/dL — ABNORMAL HIGH (ref 70–99)
Potassium: 4.2 mEq/L (ref 3.7–5.3)
SODIUM: 132 meq/L — AB (ref 137–147)

## 2013-06-14 LAB — CULTURE, BLOOD (ROUTINE X 2)

## 2013-06-14 MED ORDER — FUROSEMIDE 10 MG/ML IJ SOLN
20.0000 mg | Freq: Once | INTRAMUSCULAR | Status: AC
  Administered 2013-06-14: 20 mg via INTRAVENOUS

## 2013-06-14 MED ORDER — FUROSEMIDE 10 MG/ML IJ SOLN
40.0000 mg | Freq: Two times a day (BID) | INTRAMUSCULAR | Status: DC
  Administered 2013-06-14 – 2013-06-15 (×2): 40 mg via INTRAVENOUS
  Filled 2013-06-14 (×6): qty 4

## 2013-06-14 MED ORDER — LEVOFLOXACIN 750 MG PO TABS
750.0000 mg | ORAL_TABLET | ORAL | Status: DC
  Administered 2013-06-14: 750 mg via ORAL
  Filled 2013-06-14 (×2): qty 1

## 2013-06-14 MED ORDER — ACETAMINOPHEN 325 MG PO TABS: 650.0000 mg | ORAL_TABLET | Freq: Four times a day (QID) | ORAL | Status: DC | PRN

## 2013-06-14 NOTE — Progress Notes (Signed)
PROGRESS NOTE  Donald Blair SNK:[id number] DOB: [birth year] DOA: 06/10/2013 PCP: [name], MD  Assessment/Plan: Sepsis secondary to HCAP-left base - Urine Legionella and streptococcal antigen negative. Patient was empirically treated with IV vancomycin and Zosyn. Blood cultures x2 from admission came back positive for Moraxella-discussed with ID on 3/19 and will transition to oral levofloxacin to complete total of 14 days treatment. Improved.  Moraxella bacteremia - Likely from pneumonia. Initially treated with IV Zosyn. Transition to oral levofloxacin to complete a total 14 days treatment. Surveillance blood cultures negative to date.  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 He favors hospice care at this time and getting palliative care team involved. Palliative care consulted and discussed with Dr. Gaylan Gerold to DO NOT RESUSCITATE. Patient and family would like to treat treatable conditions.  Anxiety - continue Xanax  Questionable right lower extremities cellulitis - continue coverage with antibiotics as above. Significant pedal edema with weeping. Despite low dose Lasix, significant pedal edema persists-lower extremity venous Dopplers negative for DVT. We'll increase IV Lasix and monitor closely.  Liver metastasis - we'll monitor LFTs. Oncology following.  Generalized weakness & FTT - PT consult  Significant bilateral leg edema/anasarca: May be secondary to poor nutritional status and hypoalbuminemia.? Element of CHF but BNP low. IV Lasix. Check 2-D echo: LVEF 50-55%.  NSVT x1: Seen on telemetry on 3/16 at 8:50 AM. Check echo. Keep potassium close to 4 and magnesium close to 2. Continue monitoring on telemetry. No further events on monitor.    Diet: heart Fluids: NS DVT Prophylaxis: Lovenox  Code  Status: DO NOT RESUSCITATE Family Communication: Wife at the bedside  Disposition Plan: Inpatient  Consultants:  Oncology  Palliative care team  Procedures:  None   Antibiotics Vancomycin 3/15>> Zosyn 3/15>>  HPI/Subjective: Leg swelling not significantly less over the last 48 hours. Mild right leg pain. Denies dyspnea.  Objective: Filed Vitals:   06/13/13 1440 06/13/13 2100 06/14/13 0416 06/14/13 1438  BP: 118/95 141/84 111/86 140/75  Pulse: 114 116 113 115  Temp: 97.4 F (36.3 C) 97.7 F (36.5 C) 97.8 F (36.6 C) 98 F (36.7 C)  TempSrc: Oral Oral Oral Oral  Resp: 22 18 20 18   Height:      Weight:      SpO2: 98% 99% 98% 100%    Intake/Output Summary (Last 24 hours) at 06/14/13 1644 Last data filed at 06/14/13 1251  Gross per 24 hour  Intake    420 ml  Output    300 ml  Net    120 ml   Filed Weights   06/10/13 2154 06/11/13 0100  Weight: 134.7 kg (296 lb 15.4 oz) 135.4 kg (298 lb 8.1 oz)    Exam:   General:  Morbidly obese male, chronically ill-looking sitting up comfortably on chair.  Cardiovascular: regular rate and rhythm, without MRG. Telemetry: Sinus tachycardia in the 110s.   Respiratory: good air movement, clear to auscultation throughout, no wheezing, ronchi or rales  Abdomen: soft, not tender to palpation, positive bowel sounds  MSK: Pitting 2+ bilateral leg edema. Possibly worse erythematous changes on the right lower extremity with weeping-plateaued.  Neuro: Nonfocal  Data Reviewed: Basic Metabolic Panel:  Recent Labs Lab 06/10/13 2020 06/11/13 1200 06/12/13 0425 06/13/13 0345 06/14/13 0520  NA 129* 130* 131* 131* 132*  K 3.9 3.6* 3.6* 3.7  4.2  CL 92* 94* 96 96 97  CO2 22 24 21 22 23   GLUCOSE 130* 127* 121* 117* 103*  BUN 20 20 20 20 21   CREATININE 0.96 1.16 1.03 1.08 1.18  CALCIUM 7.7* 7.3* 7.2* 7.3* 7.1*  MG  --   --  2.5  --   --    Liver Function Tests:  Recent Labs Lab 06/10/13 2020 06/11/13 1200  06/12/13 0425  AST 128* 120* 123*  ALT 65* 60* 59*  ALKPHOS 631* 597* 592*  BILITOT 4.4* 4.1* 3.8*  PROT 6.2 5.7* 5.5*  ALBUMIN 2.2* 2.0* 1.9*    Recent Labs Lab 06/10/13 2020  LIPASE 65*   No results found for this basename: AMMONIA,  in the last 168 hours CBC:  Recent Labs Lab 06/10/13 2020 06/11/13 1200 06/12/13 0425  WBC 14.2* 11.0* 10.4  NEUTROABS 12.3*  --   --   HGB 13.7 13.1 13.9  HCT 39.5 38.1* 38.9*  MCV 84.9 85.8 87.2  PLT 230 195 193   Cardiac Enzymes: No results found for this basename: CKTOTAL, CKMB, CKMBINDEX, TROPONINI,  in the last 168 hours BNP (last 3 results)  Recent Labs  06/06/13 1230 06/10/13 2020  PROBNP 69.6 123.5   CBG: No results found for this basename: GLUCAP,  in the last 168 hours  Recent Results (from the past 240 hour(s))  CULTURE, BLOOD (ROUTINE X 2)     Status: None   Collection Time    06/10/13 10:14 PM      Result Value Ref Range Status   Specimen Description BLOOD RIGHT ARM   Final   Special Requests BOTTLES DRAWN AEROBIC AND ANAEROBIC 5CC EACH   Final   Culture  Setup Time     Final   Value: 06/11/2013 03:10     Performed at [facility]   Culture     Final   Value: MORAXELLA SPECIES     Note: BETA LACTAMASE POSITIVE     Note: Gram Stain Report Called to,Read Back By and Verified With: GWEN YATES AT 1:11 A.M. ON 06/12/13 WARRB     Performed at [facility]   Report Status 06/14/2013 FINAL   Final  CULTURE, BLOOD (ROUTINE X 2)     Status: None   Collection Time    06/10/13 10:29 PM      Result Value Ref Range Status   Specimen Description BLOOD RIGHT HAND   Final   Special Requests BOTTLES DRAWN AEROBIC AND ANAEROBIC 4CC EACH   Final   Culture  Setup Time     Final   Value: 06/11/2013 03:10     Performed at [facility]   Culture     Final   Value: MORAXELLA SPECIES     Note: BETA LACTAMASE POSITIVE     Note: Gram Stain Report Called to,Read Back By and Verified With: GWEN YEATS ON  3.17.2015 AT 12:19A BY WILEJ     Performed at [facility]   Report Status 06/14/2013 FINAL   Final  CULTURE, EXPECTORATED SPUTUM-ASSESSMENT     Status: None   Collection Time    06/11/13  2:24 AM      Result Value Ref Range Status   Specimen Description SPUTUM   Final   Special Requests NONE   Final   Sputum evaluation     Final   Value: THIS SPECIMEN IS ACCEPTABLE. RESPIRATORY CULTURE REPORT TO FOLLOW.   Report Status 06/11/2013 FINAL   Final  CULTURE, RESPIRATORY (NON-EXPECTORATED)     Status: None   Collection Time    06/11/13  2:24 AM      Result Value Ref Range Status   Specimen Description SPUTUM   Final   Special Requests NONE   Final   Gram Stain     Final   Value: MODERATE WBC PRESENT, PREDOMINANTLY PMN     FEW SQUAMOUS EPITHELIAL CELLS PRESENT     MODERATE GRAM POSITIVE RODS     FEW GRAM POSITIVE COCCI IN PAIRS     FEW YEAST   Culture     Final   Value: NORMAL OROPHARYNGEAL FLORA     Performed at [facility]   Report Status 06/13/2013 FINAL   Final     Studies: Dg Chest Port 1 View  06/13/2013   CLINICAL DATA:  HCAP  EXAM: PORTABLE CHEST - 1 VIEW  COMPARISON:  DG CHEST 1V PORT dated 06/10/2013  FINDINGS: Low lung volumes. Persistent increased density projects in the left lung base levels blunting of the left costophrenic angle. Cardiac silhouette is poorly visualized. Atherosclerotic calcifications identified within the aorta. The osseous structures are unremarkable.  IMPRESSION: Atelectasis versus infiltrate left lung base post osseous small left effusion. Repeat surveillance evaluation with PA and lateral views of the chest is recommended.   Electronically Signed   By: Margaree Mackintosh M.D.   On: 06/13/2013 12:42    Scheduled Meds: . antiseptic oral rinse  15 mL Mouth Rinse BID  . enoxaparin (LOVENOX) injection  40 mg Subcutaneous Q24H  . furosemide  40 mg Intravenous BID  . multivitamin with minerals  1 tablet Oral Daily  . potassium chloride  40  mEq Oral Daily   Continuous Infusions: . sodium chloride 20 mL/hr (06/14/13 0127)    Principal Problem:   Sepsis Active Problems:   Prostate cancer   Healthcare-associated pneumonia   Cellulitis   Dehydration   Tachycardia   Anxiety   Time spent: 35  This note has been created with Surveyor, quantity. Any transcriptional errors are unintentional.   Vernell Leep, MD, FACP, FHM. [facility] Pager [phone]  If 7PM-7AM, please contact night-coverage [email or web address] Password [email or web address] 06/14/2013, 4:44 PM     LOS: 4 days

## 2013-06-14 NOTE — Progress Notes (Signed)
ANTIBIOTIC CONSULT NOTE - FOLLOW UP  Pharmacy Consult for:  Oral Levaquin - to complete 14-day course of antibiotic treatment Indication:  HCAP and Moraxella bacteremia  No Known Allergies  Patient Measurements: Height: 5\' 11"  (180.3 cm) Weight: 298 lb 8.1 oz (135.4 kg) IBW/kg (Calculated) : 75.3   Vital Signs: Temp: 98 F (36.7 C) (03/19 1438) Temp src: Oral (03/19 1438) BP: 140/75 mmHg (03/19 1438) Pulse Rate: 115 (03/19 1438) Intake/Output from previous day: 03/18 0701 - 03/19 0700 In: 420 [P.O.:120; IV Piggyback:300] Out: 75 [Urine:75] Intake/Output from this shift: Total I/O In: 170 [P.O.:120; IV Piggyback:50] Out: 225 [Urine:225]  Labs:  Recent Labs  06/12/13 0425 06/13/13 0345 06/14/13 0520  WBC 10.4  --   --   HGB 13.9  --   --   PLT 193  --   --   CREATININE 1.03 1.08 1.18   Estimated Creatinine Clearance: 77.1 ml/min (by C-G formula based on Cr of 1.18).  Recent Labs  06/13/13 1459  VANCOTROUGH 21.7*     Microbiology: Recent Results (from the past 720 hour(s))  CULTURE, BLOOD (ROUTINE X 2)     Status: None   Collection Time    06/10/13 10:14 PM      Result Value Ref Range Status   Specimen Description BLOOD RIGHT ARM   Final   Special Requests BOTTLES DRAWN AEROBIC AND ANAEROBIC [facility] EACH   Final   Culture  Setup Time     Final   Value: 06/11/2013 03:10     Performed at [facility]   Culture     Final   Value: MORAXELLA SPECIES     Note: BETA LACTAMASE POSITIVE     Note: Gram Stain Report Called to,Read Back By and Verified With: GWEN YATES AT 1:11 A.M. ON 06/12/13 WARRB     Performed at [facility]   Report Status 06/14/2013 FINAL   Final  CULTURE, BLOOD (ROUTINE X 2)     Status: None   Collection Time    06/10/13 10:29 PM      Result Value Ref Range Status   Specimen Description BLOOD RIGHT HAND   Final   Special Requests BOTTLES DRAWN AEROBIC AND ANAEROBIC 4CC EACH   Final   Culture  Setup Time     Final   Value:  06/11/2013 03:10     Performed at [facility]   Culture     Final   Value: MORAXELLA SPECIES     Note: BETA LACTAMASE POSITIVE     Note: Gram Stain Report Called to,Read Back By and Verified With: GWEN YEATS ON 3.17.2015 AT 12:19A BY WILEJ     Performed at [facility]   Report Status 06/14/2013 FINAL   Final  CULTURE, EXPECTORATED SPUTUM-ASSESSMENT     Status: None   Collection Time    06/11/13  2:24 AM      Result Value Ref Range Status   Specimen Description SPUTUM   Final   Special Requests NONE   Final   Sputum evaluation     Final   Value: THIS SPECIMEN IS ACCEPTABLE. RESPIRATORY CULTURE REPORT TO FOLLOW.   Report Status 06/11/2013 FINAL   Final  CULTURE, RESPIRATORY (NON-EXPECTORATED)     Status: None   Collection Time    06/11/13  2:24 AM      Result Value Ref Range Status   Specimen Description SPUTUM   Final   Special Requests NONE   Final  Gram Stain     Final   Value: MODERATE WBC PRESENT, PREDOMINANTLY PMN     FEW SQUAMOUS EPITHELIAL CELLS PRESENT     MODERATE GRAM POSITIVE RODS     FEW GRAM POSITIVE COCCI IN PAIRS     FEW YEAST   Culture     Final   Value: NORMAL OROPHARYNGEAL FLORA     Performed at [facility]   Report Status 06/13/2013 FINAL   Final    Anti-infectives   Start     Dose/Rate Route Frequency Ordered Stop   06/14/13 1800  levofloxacin (LEVAQUIN) tablet 750 mg     750 mg Oral Every 24 hours 06/14/13 1734 06/23/13 1759   06/13/13 1800  vancomycin (VANCOCIN) 1,000 mg in sodium chloride 0.9 % 250 mL IVPB  Status:  Discontinued     1,000 mg 250 mL/hr over 60 Minutes Intravenous Every 12 hours 06/13/13 1640 06/14/13 1644   06/11/13 1200  vancomycin (VANCOCIN) 1,250 mg in sodium chloride 0.9 % 250 mL IVPB  Status:  Discontinued     1,250 mg 166.7 mL/hr over 90 Minutes Intravenous Every 12 hours 06/11/13 0103 06/13/13 1640   06/11/13 0500  piperacillin-tazobactam (ZOSYN) IVPB 3.375 g  Status:  Discontinued     3.375 g 12.5  mL/hr over 240 Minutes Intravenous Every 8 hours 06/11/13 0103 06/14/13 1644   06/10/13 2200  vancomycin (VANCOCIN) 2,000 mg in sodium chloride 0.9 % 500 mL IVPB     2,000 mg 250 mL/hr over 120 Minutes Intravenous  Once 06/10/13 2152 06/11/13 0153   06/10/13 2200  piperacillin-tazobactam (ZOSYN) IVPB 3.375 g     3.375 g 100 mL/hr over 30 Minutes Intravenous  Once 06/10/13 2152 06/10/13 2351      Assessment:  Asked to assist with oral Levaquin therapy for this 73 year-old male for treatment of HCAP and Moraxella bacteremia.  Today is Day #5 of Vancomycin and Zosyn therapy.  Levaquin has been ordered to complete a total of 14 days' antibiotic treatment.  Goal of Therapy:  Eradication of infection  Plan:  Levaquin 750 mg po every 24 hours for 9 days.  [place] LakesPh. 06/14/2013 5:55 PM

## 2013-06-15 LAB — BASIC METABOLIC PANEL
BUN: 23 mg/dL (ref 6–23)
CALCIUM: 7.1 mg/dL — AB (ref 8.4–10.5)
CHLORIDE: 97 meq/L (ref 96–112)
CO2: 21 meq/L (ref 19–32)
Creatinine, Ser: 1.21 mg/dL (ref 0.50–1.35)
GFR calc non Af Amer: 57 mL/min — ABNORMAL LOW (ref >90)
GFR, EST AFRICAN AMERICAN: 66 mL/min — AB (ref >90)
Glucose, Bld: 118 mg/dL — ABNORMAL HIGH (ref 70–99)
Potassium: 4 mEq/L (ref 3.7–5.3)
SODIUM: 131 meq/L — AB (ref 137–147)

## 2013-06-15 MED ORDER — ALPRAZOLAM 0.25 MG PO TABS: 0.2500 mg | ORAL_TABLET | Freq: Two times a day (BID) | ORAL | Status: DC | PRN

## 2013-06-15 MED ORDER — FUROSEMIDE 40 MG PO TABS: 60.0000 mg | ORAL_TABLET | Freq: Every day | ORAL | Status: AC

## 2013-06-15 MED ORDER — SPIRONOLACTONE 25 MG PO TABS: 25.0000 mg | ORAL_TABLET | Freq: Every day | ORAL | Status: AC

## 2013-06-15 MED ORDER — LEVOFLOXACIN 750 MG PO TABS: 750.0000 mg | ORAL_TABLET | ORAL | Status: AC

## 2013-06-15 MED ORDER — ACETAMINOPHEN 325 MG PO TABS: 650.0000 mg | ORAL_TABLET | Freq: Four times a day (QID) | ORAL | Status: AC | PRN

## 2013-06-15 NOTE — Progress Notes (Signed)
Patient Donald Blair      DOB: June 26, 1940      CBU:[id number]   Palliative Medicine Team at [facility] Progress Note    Subjective: Patient up in chair .  Seems less toxic this evening.  Legs still swollen but less red.  Spouse and patient remain focused on improving to rehab. Filed Vitals:   06/15/13 0505  BP: 109/64  Pulse: 109  Temp: 97.3 F (36.3 C)  Resp: 18   Physical exam:  General: no acute distress, more awake [place], PERRL, EOMI, anicteric Chest : decreased but clear anteriorly CVS: tachy, S1, S2, no murmur rubs or gallops Abd: obese, firm , slightly less distended Ext: tight edema, ruborous, less weeping. Neuro: more awake alert and interactive   Crt 1.18 Assessment and plan: 73 yr old white male with widely metastatic prostate cancer admitted with weakness and leg swelling.  Patient found to have pneumonia with SIRS/Sepsis, bacteremia and severe lower extremity swelling and readness consistent with stasis dermatitis vs cellulitis.  1.  Patient elected DNR status  2.  Pneumonia with bacteremia:  Continue antibiotics with goal to medically stabilize and transition to rehab with hospice care in future hopefully at home  3.  Lower extremity edema: continue cautious diuresis.   Total time 15 min  Rafik Koppel L. Lovena Le, MD MBA The Palliative Medicine Team at [facility] Phone: [phone] Pager: [phone]

## 2013-06-15 NOTE — Progress Notes (Signed)
I have reviewed all documentation from nursing student. Also, hospice education provided to his wife.

## 2013-06-15 NOTE — Progress Notes (Signed)
Discharge to [facility] SNF, tried calling 2x to give report but nobody's picking up. CSW tried still no answer. Patient discharged pick up by PTAR wife at bedside. PIV removed by student nurse in the presence of clinical instructor,  Bilateral TED hose place.

## 2013-06-15 NOTE — Progress Notes (Signed)
Pt is being transferred to a nursing home.  Ted hose were applied and IV was taken out.  When IV was taken out the area was clean, dry, and intact.  Pt still remains with pitting edema and weeping edema.  Dressing to sacral area is dry and intact.  Pt dressed in street clothes.  Wife is at bedside.  Vanetta Shawl, SN [facility]

## 2013-06-15 NOTE — Discharge Summary (Addendum)
Physician Discharge Summary  DEMETRICK EICHENBERGER OHY:[id number] DOB: [birth year] DOA: 06/10/2013  PCP: [name], MD  Admit date: 06/10/2013 Discharge date: 06/15/2013  Time spent: Greater than 30 minutes  Recommendations for Outpatient Follow-up:  1. Dr. [name], PCP upon DC from SNF. 2. MD at SNF, in 3 days with repeat labs (CBC & BMP). 3. Dr. Zola Button, Oncology 4. Recommend repeat chest x-ray in 4-6 weeks to ensure resolution of pneumonia findings. 5. Recommend palliative care services to follow at SNF if he cannot have hospice from day one. 6. Follow final surveillance blood culture results x2 that were sent from the hospital on 06/13/13.  Discharge Diagnoses:  Principal Problem:   Sepsis Active Problems:   Prostate cancer   Healthcare-associated pneumonia   Cellulitis   Dehydration   Tachycardia   Anxiety   Discharge Condition: Improved & Stable  Diet recommendation: Heart healthy diet.  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 She was admitted for suspected healthcare associated pneumonia.  Hospital Course:   1. Sepsis secondary to healthcare associated pneumonia: Patient was admitted to telemetry. He had tachycardia, tachypnea and leukocytosis. Chest x-ray showed medial left base infiltrate with underlying emphysema. He was started empirically on IV vancomycin and Zosyn. After 5 days, this was transitioned to oral levofloxacin on 3/19 (will also cover for bacteremia) and  will complete a total 8 day course for this indication. Recommend repeating chest x-ray in 4-6 weeks to ensure resolution of pneumonia findings. 2. Bilateral leg edema +/- right leg cellulitis of right leg: Patient had some chronic leg edema? Secondary to chronic venous stasis which was worsened recently-according to spouse thinks it is due to [place]. This could be multifactorial from metastatic liver disease, chronic venous insufficiency and poor oncotic pressure secondary to hypoalbuminemia. Despite IV diuresis, there is only mild improvement of his leg edema. Discussed with his primary oncologist today and we agree that we can transition him to oral diuretics and not be very aggressive which may cause other complications such as renal insufficiency. His creatinines are slightly higher today. Will also plan bilateral lower extremity compression stockings. His right leg, lateral aspect continues to have oozing of clear fluid and not convincing for cellulitis on exam. In any event, levofloxacin should cover. Bilateral lower extremity venous Dopplers were negative for DVT. 3. Moraxella bacteremia: Likely secondary to pneumonia. Discussed with infectious disease M.D. on 3/19 who recommended transitioning from Zosyn to oral levofloxacin and complete total of 14 days treatment. 4. Advanced prostate cancer with metastatic disease to the bone and now possibly to the liver: Oncology consulted and indicated that his overall prognosis was poor and he was very skeptical about patient's ability to handle further systemic chemotherapy. He recommended palliative care consultation and hospice oriented care. Palliative care team evaluated and patient opted for DO NOT RESUSCITATE but wished to continue treatable conditions. 5. Anxiety: Continue when necessary Xanax. 6. Nausea and vomiting: Resolved except occasional episodes of nausea and had one episode of nonbloody emesis 2 days ago. 7. Weakness and failure to thrive:  Secondary to acute medical illness complicating underlying metastatic prostate cancer. Management as above. 8. NSVT x1: Seen on telemetry on 3/16. Repleted potassium and magnesium. 9. Elevated TSH: Clinically looks euthyroid. Depending on how aggressive he or family wish to be, may consider repeating TSH in 4-6 weeks. 10. DO NOT RESUSCITATE   Consultations:  Gastroenterology  Medical oncology  Palliative care medicine  Procedures:  None    Discharge Exam:  Complaints:  Leg swelling slightly better. Denies cough or dyspnea. No further nausea or vomiting. Denies leg pain. No complaints of diarrhea.  Filed Vitals:   06/14/13 2110 06/15/13 0505 06/15/13 0535 06/15/13 1005  BP: 103/58 109/64  131/62  Pulse: 113 109  102  Temp: 98.4 F (36.9 C) 97.3 F (36.3 C)  98 F (36.7 C)  TempSrc: Oral Oral  Oral  Resp: 18 18  25   Height:      Weight:   137.848 kg (303 lb 14.4 oz)   SpO2: 97% 97%  97%   General: Morbidly obese male, chronically ill-looking sitting up comfortably on chair.  Cardiovascular: regular rate and rhythm, without MRG. Telemetry: Sinus tachycardia in the 110s.  Respiratory: good air movement, clear to auscultation throughout, no wheezing, ronchi or rales  Abdomen: soft, not tender to palpation, positive bowel sounds  MSK: Pitting 2+ bilateral leg edema. Weeping clear liquid from right leg lateral aspect. No acute findings suggestive of cellulitis or abscess. Neuro: Nonfocal  Discharge Instructions      Discharge Orders   Future Appointments Provider Department Dept Phone   06/22/2013 9:15 AM Wyatt Portela, [facility] [phone]   06/22/2013 9:45 AM [facility] [phone]   Future Orders Complete By Expires   Call MD for:  difficulty breathing, headache or visual disturbances  As directed    Call MD for:  severe uncontrolled pain  As directed    Call MD for:   temperature >100.4  As directed    Diet - low sodium heart healthy  As directed    Discharge instructions  As directed    Comments:     Apply bilateral lower extremity thigh high compression stockings.   Increase activity slowly  As directed        Medication List    STOP taking these medications       enzalutamide 40 MG capsule  Commonly known as:  XTANDI     naproxen sodium 220 MG tablet  Commonly known as:  ANAPROX      TAKE these medications       acetaminophen 325 MG tablet  Commonly known as:  TYLENOL  Take 2 tablets (650 mg total) by mouth every 6 (six) hours as needed for mild pain, moderate pain, fever or headache.     ALPRAZolam 0.25 MG tablet  Commonly known as:  XANAX  Take 1 tablet (0.25 mg total) by mouth 2 (two) times daily as needed for anxiety.     calcium citrate-vitamin D 315-200 MG-UNIT per tablet  Commonly known as:  CITRACAL+D  Take 1 tablet by mouth 2 (two) times daily.     furosemide 40 MG tablet  Commonly known as:  LASIX  Take 1.5 tablets (60 mg total) by mouth daily.     levofloxacin 750 MG tablet  Commonly known as:  LEVAQUIN  Take 1 tablet (750 mg total) by mouth daily. Discontinue after 06/22/2013 dose.     multivitamin with minerals tablet  Take 1 tablet by mouth daily.     ondansetron 8 MG tablet  Commonly known as:  ZOFRAN  Take 1 tablet (8 mg total) by mouth every 12 (twelve) hours as needed for nausea.     potassium chloride SA 20 MEQ tablet  Commonly known as:  K-DUR,KLOR-CON  Take 1 tablet (20 mEq total) by mouth daily.     spironolactone 25 MG tablet  Commonly known as:  ALDACTONE  Take 1 tablet (25 mg total) by mouth daily.       Follow-up Information   Schedule an appointment as soon as possible for a visit with [name], MD.   Specialty:  Family Medicine   Contact information:   [place] [place] [address] [phone]       Follow up with MD at SNF . Schedule an appointment as  soon as possible for a visit in 3 days. (To be seen with repeat labs (CBC & BMP))       Schedule an appointment as soon as possible for a visit with [facility], MD.   Specialty:  Oncology   Contact information:   [address] [place] [address] [phone]        The results of significant diagnostics from this hospitalization (including imaging, microbiology, ancillary and laboratory) are listed below for reference.    Significant Diagnostic Studies: Ct Angio Chest W/cm &/or Wo Cm  06/06/2013   CLINICAL DATA Increasing shortness of breath and leg swelling. Increasing liver function tests. Question metastatic cancer. Question pulmonary embolism.  EXAM CT ANGIOGRAPHY CHEST  CT ABDOMEN AND PELVIS WITH CONTRAST  TECHNIQUE Multidetector CT imaging of the chest was performed using the standard protocol during bolus administration of intravenous contrast. Multiplanar CT image reconstructions and MIPs were obtained to evaluate the vascular anatomy. Multidetector CT imaging of the abdomen and pelvis was performed using the standard protocol during bolus administration of intravenous contrast.  CONTRAST 36mL OMNIPAQUE IOHEXOL 300 MG/ML SOLN, 123mL OMNIPAQUE IOHEXOL 350 MG/ML SOLN  COMPARISON 02/09/2013 chest CT.  11/14/2012 abdominal CT  FINDINGS CTA CHEST FINDINGS  THORACIC INLET/BODY WALL:  Irregularly-shaped mass in the left supraclavicular station is decreased in extent and bulk. The mass measures up to 4.3 cm in maximal dimension, previously 5.6 cm. Decreasing encasement of the left subclavian artery and internal mammary origin, without narrowing of these vessels. Left axillary lymphadenopathy is new from prior, with the largest node measuring 23 x 18 mm. Bilateral thyroid nodules which are considered incidental due to size and clinical circumstances.  MEDIASTINUM:  No cardiomegaly. No pericardial effusion. Atherosclerosis of multiple arterial structures, including the coronary arteries. There is  new enlargement of mediastinal nodes. AP window node currently measures 17 mm short axis. Lower right peritracheal node is rounded, 13 mm. Larger lower right periesophageal node, 2 cm in diameter.  LUNG WINDOWS:  Bibasilar atelectasis. No evidence for new pulmonary metastatic disease. A 6 mm pulmonary nodule in the lower right middle lobe is stable in size.  OSSEOUS:  Sclerotic metastases throughout the imaged skeleton. The gross distribution is stable from prior. There is no evidence of new extra osseous tumor spread or acute pathologic fracture.  CT ABDOMEN and PELVIS FINDINGS  ABDOMEN/PELVIS:  Liver: New hepatomegaly with patchy, geographic low-attenuation.  Biliary: Numerous gallstones within the collapsed gallbladder. No biliary ductal dilatation.  Pancreas: Unremarkable.  Spleen: Unremarkable.  Adrenals: Enlarging right adrenal mass, currently 3 x 2.1 cm.  Kidneys and ureters: Bilateral renal cysts. No evidence  of solid mass. No hydronephrosis.  Bladder: Unremarkable.  Reproductive: Brachytherapy seeds within the prostate bed and surrounding soft tissues. No increasing tissue in this region.  Bowel: No obstruction. Normal appendix.  Retroperitoneum: Diffuse decrease in size of the preaortic and periaortic lymphadenopathy. There is increased retroperitoneal stranding in this region which likely reflects scarring. No narrowing of the aorta or ureters. Index node along the lower left periaortic station was previously 3 cm, currently 18 mm in short axis. There is mild interval enlargement of nodes in the deep liver drainage, with a node just anterior to the hepatic artery measuring 3 cm x 1.9 cm, previously 12 x 19 mm.  Peritoneum: Small volume ascites, predominantly in the upper abdomen. The fluid is water density. No peritoneal nodularity.  Vascular: No acute abnormality.  OSSEOUS: Sclerotic lesions throughout the imaged skeleton, consistent with known metastatic disease. No evidence of extra osseous extension.  No pathologic fracture.  Review of the MIP images confirms the above findings.  IMPRESSION 1. Negative for pulmonary embolism or other acute intrathoracic disease. 2. Mixed response to therapy. While periaortic and left supraclavicular lymphadenopathy has decreased in size, there is new adenopathy in the left axilla and an enlarging right adrenal gland metastasis. 3. Since 11/14/2012, new diffuse hepatic abnormality which could represent extensive infiltrative tumor or patchy steatosis. MRI could differentiate if clinically warranted. There is new ascites, small volume, which is presumably related.  SIGNATURE  Electronically Signed   By: Jorje Guild M.D.   On: 06/06/2013 14:12   Ct Abdomen Pelvis W Contrast  06/06/2013   CLINICAL DATA Increasing shortness of breath and leg swelling. Increasing liver function tests. Question metastatic cancer. Question pulmonary embolism.  EXAM CT ANGIOGRAPHY CHEST  CT ABDOMEN AND PELVIS WITH CONTRAST  TECHNIQUE Multidetector CT imaging of the chest was performed using the standard protocol during bolus administration of intravenous contrast. Multiplanar CT image reconstructions and MIPs were obtained to evaluate the vascular anatomy. Multidetector CT imaging of the abdomen and pelvis was performed using the standard protocol during bolus administration of intravenous contrast.  CONTRAST 46mL OMNIPAQUE IOHEXOL 300 MG/ML SOLN, 168mL OMNIPAQUE IOHEXOL 350 MG/ML SOLN  COMPARISON 02/09/2013 chest CT.  11/14/2012 abdominal CT  FINDINGS CTA CHEST FINDINGS  THORACIC INLET/BODY WALL:  Irregularly-shaped mass in the left supraclavicular station is decreased in extent and bulk. The mass measures up to 4.3 cm in maximal dimension, previously 5.6 cm. Decreasing encasement of the left subclavian artery and internal mammary origin, without narrowing of these vessels. Left axillary lymphadenopathy is new from prior, with the largest node measuring 23 x 18 mm. Bilateral thyroid nodules which  are considered incidental due to size and clinical circumstances.  MEDIASTINUM:  No cardiomegaly. No pericardial effusion. Atherosclerosis of multiple arterial structures, including the coronary arteries. There is new enlargement of mediastinal nodes. AP window node currently measures 17 mm short axis. Lower right peritracheal node is rounded, 13 mm. Larger lower right periesophageal node, 2 cm in diameter.  LUNG WINDOWS:  Bibasilar atelectasis. No evidence for new pulmonary metastatic disease. A 6 mm pulmonary nodule in the lower right middle lobe is stable in size.  OSSEOUS:  Sclerotic metastases throughout the imaged skeleton. The gross distribution is stable from prior. There is no evidence of new extra osseous tumor spread or acute pathologic fracture.  CT ABDOMEN and PELVIS FINDINGS  ABDOMEN/PELVIS:  Liver: New hepatomegaly with patchy, geographic low-attenuation.  Biliary: Numerous gallstones within the collapsed gallbladder. No biliary ductal dilatation.  Pancreas: Unremarkable.  Spleen: Unremarkable.  Adrenals: Enlarging right adrenal mass, currently 3 x 2.1 cm.  Kidneys and ureters: Bilateral renal cysts. No evidence of solid mass. No hydronephrosis.  Bladder: Unremarkable.  Reproductive: Brachytherapy seeds within the prostate bed and surrounding soft tissues. No increasing tissue in this region.  Bowel: No obstruction. Normal appendix.  Retroperitoneum: Diffuse decrease in size of the preaortic and periaortic lymphadenopathy. There is increased retroperitoneal stranding in this region which likely reflects scarring. No narrowing of the aorta or ureters. Index node along the lower left periaortic station was previously 3 cm, currently 18 mm in short axis. There is mild interval enlargement of nodes in the deep liver drainage, with a node just anterior to the hepatic artery measuring 3 cm x 1.9 cm, previously 12 x 19 mm.  Peritoneum: Small volume ascites, predominantly in the upper abdomen. The fluid is  water density. No peritoneal nodularity.  Vascular: No acute abnormality.  OSSEOUS: Sclerotic lesions throughout the imaged skeleton, consistent with known metastatic disease. No evidence of extra osseous extension. No pathologic fracture.  Review of the MIP images confirms the above findings.  IMPRESSION 1. Negative for pulmonary embolism or other acute intrathoracic disease. 2. Mixed response to therapy. While periaortic and left supraclavicular lymphadenopathy has decreased in size, there is new adenopathy in the left axilla and an enlarging right adrenal gland metastasis. 3. Since 11/14/2012, new diffuse hepatic abnormality which could represent extensive infiltrative tumor or patchy steatosis. MRI could differentiate if clinically warranted. There is new ascites, small volume, which is presumably related.  SIGNATURE  Electronically Signed   By: Jorje Guild M.D.   On: 06/06/2013 14:12   US Abdomen Limited  06/11/2013   CLINICAL DATA:  History of metastatic prostate cancer. Weakness. Possible metastatic disease the liver by CT abdomen and pelvis.  EXAM: US ABDOMEN LIMITED - RIGHT UPPER QUADRANT  COMPARISON:  CT chest, abdomen and pelvis 06/06/2013.  FINDINGS: Gallbladder:  The gallbladder is contracted with stones present. No wall thickening or pericholecystic fluid.  Common bile duct:  Diameter: 0.3 cm.  Liver:  As seen on CT scan, innumerable very large hypoechoic lesions are present in the liver. No intrahepatic biliary ductal dilatation is identified.  IMPRESSION: Multiple of hepatic lesions likely due to metastatic prostate cancer. As noted on report of prior CT abdomen and pelvis, MRI could be used for further evaluation.  Gallstones without evidence cholecystitis.   Electronically Signed   By: Inge Rise M.D.   On: 06/11/2013 00:11   Dg Chest Port 1 View  06/13/2013   CLINICAL DATA:  HCAP  EXAM: PORTABLE CHEST - 1 VIEW  COMPARISON:  DG CHEST 1V PORT dated 06/10/2013  FINDINGS: Low lung  volumes. Persistent increased density projects in the left lung base levels blunting of the left costophrenic angle. Cardiac silhouette is poorly visualized. Atherosclerotic calcifications identified within the aorta. The osseous structures are unremarkable.  IMPRESSION: Atelectasis versus infiltrate left lung base post osseous small left effusion. Repeat surveillance evaluation with PA and lateral views of the chest is recommended.   Electronically Signed   By: Margaree Mackintosh M.D.   On: 06/13/2013 12:42   Dg Chest Portable 1 View  06/10/2013   CLINICAL DATA:  Dyspnea  EXAM: PORTABLE CHEST - 1 VIEW  COMPARISON:  Chest radiograph and chest CT June 06, 2013  FINDINGS: There is consolidation in the medial left base. There is underlying emphysematous change. Elsewhere, the lungs are clear. Heart size and pulmonary vascularity are normal. No  adenopathy.  IMPRESSION: Medial left base infiltrate.  Underlying emphysema.   Electronically Signed   By: Lowella Grip M.D.   On: 06/10/2013 21:00   Dg Chest Portable 1 View  06/06/2013   CLINICAL DATA Shortness of breath, weakness  EXAM PORTABLE CHEST - 1 VIEW  COMPARISON CT ANGIO CHEST W/CM &/OR WO/CM dated 02/09/2013  FINDINGS Low lung volumes. There is a spiculated opacity at the right lung base medially. There is no other focal consolidation, pleural effusion or pneumothorax. Normal heart size.  There is thickening of the left peritracheal soft tissues concerning for adenopathy.  The osseous structures are unremarkable.  IMPRESSION Low lung volumes. Spiculated opacity at the right lung base medially which may reflect atelectasis versus pneumonia. Follow up PA and lateral radiographs of the chest are recommended.  There is thickening of the left peritracheal soft tissues concerning for adenopathy.  SIGNATURE  Electronically Signed   By: Kathreen Devoid   On: 06/06/2013 13:15    Microbiology: Recent Results (from the past 240 hour(s))  CULTURE, BLOOD (ROUTINE X 2)      Status: None   Collection Time    06/10/13 10:14 PM      Result Value Ref Range Status   Specimen Description BLOOD RIGHT ARM   Final   Special Requests BOTTLES DRAWN AEROBIC AND ANAEROBIC [facility] EACH   Final   Culture  Setup Time     Final   Value: 06/11/2013 03:10     Performed at [facility]   Culture     Final   Value: MORAXELLA SPECIES     Note: BETA LACTAMASE POSITIVE     Note: Gram Stain Report Called to,Read Back By and Verified With: GWEN YATES AT 1:11 A.M. ON 06/12/13 WARRB     Performed at [facility]   Report Status 06/14/2013 FINAL   Final  CULTURE, BLOOD (ROUTINE X 2)     Status: None   Collection Time    06/10/13 10:29 PM      Result Value Ref Range Status   Specimen Description BLOOD RIGHT HAND   Final   Special Requests BOTTLES DRAWN AEROBIC AND ANAEROBIC 4CC EACH   Final   Culture  Setup Time     Final   Value: 06/11/2013 03:10     Performed at [facility]   Culture     Final   Value: MORAXELLA SPECIES     Note: BETA LACTAMASE POSITIVE     Note: Gram Stain Report Called to,Read Back By and Verified With: GWEN YEATS ON 3.17.2015 AT 12:19A BY WILEJ     Performed at [facility]   Report Status 06/14/2013 FINAL   Final  CULTURE, EXPECTORATED SPUTUM-ASSESSMENT     Status: None   Collection Time    06/11/13  2:24 AM      Result Value Ref Range Status   Specimen Description SPUTUM   Final   Special Requests NONE   Final   Sputum evaluation     Final   Value: THIS SPECIMEN IS ACCEPTABLE. RESPIRATORY CULTURE REPORT TO FOLLOW.   Report Status 06/11/2013 FINAL   Final  CULTURE, RESPIRATORY (NON-EXPECTORATED)     Status: None   Collection Time    06/11/13  2:24 AM      Result Value Ref Range Status   Specimen Description SPUTUM   Final   Special Requests NONE   Final   Gram Stain     Final  Value: MODERATE WBC PRESENT, PREDOMINANTLY PMN     FEW SQUAMOUS EPITHELIAL CELLS PRESENT     MODERATE GRAM POSITIVE RODS     FEW GRAM  POSITIVE COCCI IN PAIRS     FEW YEAST   Culture     Final   Value: NORMAL OROPHARYNGEAL FLORA     Performed at [facility]   Report Status 06/13/2013 FINAL   Final  CULTURE, BLOOD (ROUTINE X 2)     Status: None   Collection Time    06/13/13  6:55 PM      Result Value Ref Range Status   Specimen Description     Final   Value: BLOOD LEFT HAND     Performed at [facility]     Final   Value: BOTTLES DRAWN AEROBIC AND ANAEROBIC 10CC     Performed at [facility]   Culture  Setup Time     Final   Value: 06/14/2013 00:28     Performed at [facility]   Culture     Final   Value:        BLOOD CULTURE RECEIVED NO GROWTH TO DATE CULTURE WILL BE HELD FOR 5 DAYS BEFORE ISSUING A FINAL NEGATIVE REPORT     Performed at [facility]   Report Status PENDING   Incomplete  CULTURE, BLOOD (ROUTINE X 2)     Status: None   Collection Time    06/13/13  7:05 PM      Result Value Ref Range Status   Specimen Description BLOOD LEFT ARM   Final   Special Requests BOTTLES DRAWN AEROBIC AND ANAEROBIC 10CC   Final   Culture  Setup Time     Final   Value: 06/14/2013 00:28     Performed at [facility]   Culture     Final   Value:        BLOOD CULTURE RECEIVED NO GROWTH TO DATE CULTURE WILL BE HELD FOR 5 DAYS BEFORE ISSUING A FINAL NEGATIVE REPORT     Performed at [facility]   Report Status PENDING   Incomplete     Labs: Basic Metabolic Panel:  Recent Labs Lab 06/11/13 1200 06/12/13 0425 06/13/13 0345 06/14/13 0520 06/15/13 0400  NA 130* 131* 131* 132* 131*  K 3.6* 3.6* 3.7 4.2 4.0  CL 94* 96 96 97 97  CO2 24 21 22 23 21   GLUCOSE 127* 121* 117* 103* 118*  BUN 20 20 20 21 23   CREATININE 1.16 1.03 1.08 1.18 1.21  CALCIUM 7.3* 7.2* 7.3* 7.1* 7.1*  MG  --  2.5  --   --   --    Liver Function Tests:  Recent Labs Lab 06/10/13 2020 06/11/13 1200 06/12/13 0425  AST 128* 120* 123*  ALT 65* 60* 59*  ALKPHOS 631*  597* 592*  BILITOT 4.4* 4.1* 3.8*  PROT 6.2 5.7* 5.5*  ALBUMIN 2.2* 2.0* 1.9*    Recent Labs Lab 06/10/13 2020  LIPASE 65*   No results found for this basename: AMMONIA,  in the last 168 hours CBC:  Recent Labs Lab 06/10/13 2020 06/11/13 1200 06/12/13 0425  WBC 14.2* 11.0* 10.4  NEUTROABS 12.3*  --   --   HGB 13.7 13.1 13.9  HCT 39.5 38.1* 38.9*  MCV 84.9 85.8 87.2  PLT 230 195 193   Cardiac Enzymes: No results found for this basename: CKTOTAL, CKMB, CKMBINDEX, TROPONINI,  in the last  168 hours BNP: BNP (last 3 results)  Recent Labs  06/06/13 1230 06/10/13 2020  PROBNP 69.6 123.5   CBG: No results found for this basename: GLUCAP,  in the last 168 hours  Additional labs: 1. TSH: 5.058 2. PSA 06/06/13:3236 3. Influenza panel PCR: Negative 4. HIV antibody: Nonreactive 5. Urine Legionella and streptococcal antigen: Negative 6. 2 D Echo: 06/13/2013: Study Conclusions  - Procedure narrative: Transthoracic echocardiography.The study was verytechnically difficult, as a result of body habitus. - Left ventricle: The cavity size was normal. Wall thickness was normal. Systolic function was normal. The estimated ejection fraction was in the range of 50% to 55%. Regional wall motion abnormalities cannot be excluded. The study is not technically sufficient to allow evaluation of LV diastolic function. - Left atrium: The atrium was normal in size. 7. Bilateral lower extremity venous Dopplers 06/06/13: Summary:  - No obvious evidence of deep vein or superficial thrombosis involving the right lower extremity and left lower extremity. - No evidence of Baker's cyst on the right or left.       Signed:  Vernell Leep, MD, FACP, FHM. [facility] Pager [phone]  If 7PM-7AM, please contact night-coverage [email or web address] Password [email or web address] 06/15/2013, 12:00 PM

## 2013-06-15 NOTE — Progress Notes (Signed)
Patient is set to discharge to [facility] today. Patient & wife at bedside aware. Discharge packet given to RN, Donald Blair. [facility] called for transport pickup.   Clinical Social Work Department CLINICAL SOCIAL WORK PLACEMENT NOTE 06/15/2013  Patient:  Donald Blair, Donald Blair  Account Number:  [id number] Admit date:  06/10/2013  Clinical Social Worker:  Renold Genta  Date/time:  06/12/2013 03:36 PM  Clinical Social Work is seeking post-discharge placement for this patient at the following level of care:   SKILLED NURSING   (*CSW will update this form in Epic as items are completed)   06/12/2013  Patient/family provided with [place] Department of Clinical Social Work's list of facilities offering this level of care within the geographic area requested by the patient (or if unable, by the patient's family).  06/12/2013  Patient/family informed of their freedom to choose among providers that offer the needed level of care, that participate in Medicare, Medicaid or managed care program needed by the patient, have an available bed and are willing to accept the patient.  06/12/2013  Patient/family informed of [facility]' ownership interest in [facility], as well as of the fact that they are under no obligation to receive care at this facility.  PASARR submitted to EDS on 06/12/2013 PASARR number received from EDS on 06/12/2013  FL2 transmitted to all facilities in geographic area requested by pt/family on  06/12/2013 FL2 transmitted to all facilities within larger geographic area on   Patient informed that his/her managed care company has contracts with or will negotiate with  certain facilities, including the following:     Patient/family informed of bed offers received:  06/12/2013 Patient chooses bed at [facility] Physician recommends and patient chooses bed at    Patient to be transferred to [facility] on  06/15/2013 Patient  to be transferred to facility by [facility]  The following physician request were entered in Epic:   Additional Comments:   Donald Blair, [place] Worker cell #: [phone]

## 2013-06-15 NOTE — Progress Notes (Signed)
Physical Therapy Treatment Patient Details Name: Donald Blair MRN: [id number] DOB: 12-07-1940 Today's Date: 06/15/2013 Time: 5035-4656 PT Time Calculation (min): 13 min  PT Assessment / Plan / Recommendation  History of Present Illness 73 yo male admitted with sepsis. 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    PT Comments   Progressing slowly with mobility. Continues to fatigue easily with ambulation. Bil LEs appear to be draining more;[place]ouos.   Follow Up Recommendations  SNF     Does the patient have the potential to tolerate intense rehabilitation     Barriers to Discharge        Equipment Recommendations  None recommended by PT    Recommendations for Other Services OT consult  Frequency Min 3X/week   Progress towards PT Goals Progress towards PT goals: Progressing toward goals  Plan Current plan remains appropriate    Precautions / Restrictions Precautions Precautions: Fall Restrictions Weight Bearing Restrictions: No   Pertinent Vitals/Pain No c/o pain    Mobility  Bed Mobility General bed mobility comments: pt sitting in recliner Transfers Overall transfer level: Needs assistance Equipment used: Rolling walker (2 wheeled) Transfers: Sit to/from Stand Sit to Stand: Min guard General transfer comment: close guard for safety Ambulation/Gait Ambulation/Gait assistance: Min assist Ambulation Distance (Feet): 75 Feet (x2) Assistive device: Rolling walker (2 wheeled) Gait Pattern/deviations: Step-through pattern;Decreased stride length;Decreased step length - left;Decreased step length - right General Gait Details: slow gait speed. fatigues easily. dyspnea 3/4 with ambulation. 1 seated rest break needed between walks.     Exercises     PT Diagnosis:    PT Problem List:   PT Treatment Interventions:     PT Goals (current goals can now be found in the care plan section)    Visit Information  Last PT Received On:  06/15/13 Assistance Needed: +1 History of Present Illness: 73 yo male admitted with sepsis. 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     Subjective Data      Cognition  Cognition Arousal/Alertness: Awake/alert Behavior During Therapy: WFL for tasks assessed/performed Overall Cognitive Status: Within Functional Limits for tasks assessed    Balance     End of Session PT - End of Session Activity Tolerance: Patient limited by fatigue Patient left: in chair;with call bell/phone within reach;with family/visitor present   GP     [name], MPT Pager: [phone]

## 2013-06-15 NOTE — Progress Notes (Signed)
BP is a 73 yo male whose main concern is sepsis.  He is also a potential pneumonia patient.  He has a history of prostate cancer, arthritis, SOB.  He is on 80 mg of Lasix daily.  He is alert and oriented x4.  He has 2+ edema all over and ascites, his abdomen is non-tender. His respiratory pattern is labored, he has dyspnea at rest, and uses accessory muscles.  Lung fields are clear; right upper and left upper breath sounds are slightly diminished.  Tongue is red and dry.  Mucous membranes are moist and pink.  Right and left eye are weak to respond to light.  Skin is dry and flaky, skin turgor is non-tenting.  Cap refill is less than 3 seconds.  Patient complains of generalized weakness.  He is sitting in chair.  Wife is at bedside.    Vanetta Shawl, SN [facility]

## 2013-06-18 ENCOUNTER — Non-Acute Institutional Stay (SKILLED_NURSING_FACILITY): Payer: Medicare Other | Admitting: Internal Medicine

## 2013-06-18 ENCOUNTER — Encounter: Payer: Self-pay | Admitting: Internal Medicine

## 2013-06-18 DIAGNOSIS — J189 Pneumonia, unspecified organism: Secondary | ICD-10-CM

## 2013-06-18 DIAGNOSIS — R7989 Other specified abnormal findings of blood chemistry: Secondary | ICD-10-CM

## 2013-06-18 DIAGNOSIS — F411 Generalized anxiety disorder: Secondary | ICD-10-CM

## 2013-06-18 DIAGNOSIS — R600 Localized edema: Secondary | ICD-10-CM

## 2013-06-18 DIAGNOSIS — R946 Abnormal results of thyroid function studies: Secondary | ICD-10-CM

## 2013-06-18 DIAGNOSIS — A419 Sepsis, unspecified organism: Secondary | ICD-10-CM

## 2013-06-18 DIAGNOSIS — R000 Tachycardia, unspecified: Secondary | ICD-10-CM

## 2013-06-18 DIAGNOSIS — F419 Anxiety disorder, unspecified: Secondary | ICD-10-CM

## 2013-06-18 DIAGNOSIS — R627 Adult failure to thrive: Secondary | ICD-10-CM

## 2013-06-18 DIAGNOSIS — C61 Malignant neoplasm of prostate: Secondary | ICD-10-CM

## 2013-06-18 DIAGNOSIS — R609 Edema, unspecified: Secondary | ICD-10-CM

## 2013-06-18 NOTE — Progress Notes (Signed)
MRN: [id number] Name: Donald Blair  Sex: male Age: 73 y.o. DOB: Jul 19, 1940  [place] #: Helene Kelp Facility/Room: 112 Level Of Care: SNF Provider: Inocencio Homes D Emergency Contacts: Extended Emergency Contact Information Primary Emergency Contact: Safley,Judith S Address: [address]          [place], [place] [address] [place] Phone: [phone] Relation: Spouse Secondary Emergency Contact: Costella Hatcher [place] Phone: [phone] Relation: Son  Code Status: DNR  Allergies: Review of patient's allergies indicates no known allergies.  Chief Complaint  Patient presents with  . nursing home admission    HPI: Patient is 73 y.o. male who is admitted after sepsis, h/o metastatic prostate CA, for OT/PT for weakness and deconditioning.  Past Medical History  Diagnosis Date  . Cancer     prostate  . Hyperlipidemia   . Wears glasses   . Shortness of breath   . Arthritis   . 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 Lymphadenopathy, supraclavicular     left, prostate primary  . Prostate cancer 2005    xrt w/seed implant  . Hx of radiation therapy 04/18/13- 05/07/13    left neck/upper mediastinum 3500 cGy in 14 sessions  . Pneumonia     Past Surgical History  Procedure Laterality Date  . Tonsillectomy    . Hernia repair  1972    lt ing  . Knee arthroscopy  1999    right  . Knee arthroscopy  2001    left  . Transperineal implant of radiation seeds w/ ultrasound  2005    prostate  . Shoulder arthroscopy with rotator cuff repair Right 10/03/2012    Procedure: RIGHT SHOULDER ARTHROSCOPY WITH OPEN ROTATOR CUFF REPAIR AND DISTAL CLAVICLE EXCISION;  Surgeon: Cammie Sickle., MD;  Location: [facility];  Service: Orthopedics;  Laterality: Right;      Medication List       This list is accurate as of: 06/18/13 11:18 PM.  Always use your most recent med list.               acetaminophen 325 MG  tablet  Commonly known as:  TYLENOL  Take 2 tablets (650 mg total) by mouth every 6 (six) hours as needed for mild pain, moderate pain, fever or headache.     ALPRAZolam 0.25 MG tablet  Commonly known as:  XANAX  Take 1 tablet (0.25 mg total) by mouth 2 (two) times daily as needed for anxiety.     calcium citrate-vitamin D 315-200 MG-UNIT per tablet  Commonly known as:  CITRACAL+D  Take 1 tablet by mouth 2 (two) times daily.     furosemide 40 MG tablet  Commonly known as:  LASIX  Take 1.5 tablets (60 mg total) by mouth daily.     levofloxacin 750 MG tablet  Commonly known as:  LEVAQUIN  Take 1 tablet (750 mg total) by mouth daily. Discontinue after 06/22/2013 dose.     multivitamin with minerals tablet  Take 1 tablet by mouth daily.     ondansetron 8 MG tablet  Commonly known as:  ZOFRAN  Take 1 tablet (8 mg total) by mouth every 12 (twelve) hours as needed for nausea.     potassium chloride SA 20 MEQ tablet  Commonly known as:  K-DUR,KLOR-CON  Take 1 tablet (20 mEq total) by mouth daily.     spironolactone 25 MG tablet  Commonly known as:  ALDACTONE  Take 1 tablet (25 mg total) by mouth daily.        No orders of the defined types were placed in this encounter.     There is no immunization history on file for this patient.  History  Substance Use Topics  . Smoking status: Former Research scientist (life sciences)  . Smokeless tobacco: Current User    Types: Chew  . Alcohol Use: No    Family history is noncontributory    Review of Systems  DATA OBTAINED: from patient,son GENERAL:  no fevers,+ fatigue, appetite changes SKIN: No itching, rash, weeping R leg EYES: No eye pain, redness, discharge EARS: No earache, tinnitus, change in hearing NOSE: No congestion, drainage or bleeding  MOUTH/THROAT: No mouth or tooth pain, No sore throat RESPIRATORY: No cough, wheezing, SOB CARDIAC: No chest pain, palpitations,+ lower extremity edema  GI: No abdominal pain, No N/V/D or constipation, No  heartburn or reflux  GU: No dysuria, frequency or urgency, or incontinence  MUSCULOSKELETAL: No unrelieved bone/joint pain NEUROLOGIC: No headache, dizziness or focal weakness PSYCHIATRIC: No overt anxiety or sadness. Sleeps well. No behavior issue.   Filed Vitals:   06/18/13 2316  BP: 131/76  Pulse: 116  Temp: 98.3 F (36.8 C)  Resp: 16    Physical Exam  GENERAL APPEARANCE: Alert, conversant. Appropriately groomed. No acute distress.  SKIN: No diaphoresis rash; clear weepage R leg minor abrasion, pallor with slt icteric cast HEAD: Normocephalic, atraumatic  EYES: Conjunctiva/lids clear. Pupils round, reactive. EOMs intact.  EARS: External exam WNL, canals clear. Hearing grossly normal.  NOSE: No deformity or discharge.  MOUTH/THROAT: Lips w/o lesions  RESPIRATORY: Breathing is even, unlabored. Lung sounds are clear   CARDIOVASCULAR: Heart RRR no murmurs, rubs or gallops. large peripheral edema.  GASTROINTESTINAL: Abdomen is soft, non-tender, mod distended w/ normal bowel sounds. GENITOURINARY: Bladder non tender, not distended  MUSCULOSKELETAL: No abnormal joints or musculature NEUROLOGIC: Oriented X3. Cranial nerves 2-12 grossly intact. Moves all extremities no tremor. PSYCHIATRIC: Mood and affect appropriate to situation, no behavioral issues  Patient Active Problem List   Diagnosis Date Noted  . Bilateral leg edema 06/18/2013  . Elevated TSH 06/18/2013  . FTT (failure to thrive) in adult 06/18/2013  . Sepsis 06/10/2013  . Healthcare-associated pneumonia 06/10/2013  . Cellulitis 06/10/2013  . Dehydration 06/10/2013  . Tachycardia 06/10/2013  . Anxiety 06/10/2013  . Secondary and unspecified malignant neoplasm of lymph nodes of head, face, and neck   . Cancer   . Metastasis to bone   . Prostate cancer 12/11/2012  . Bone metastasis 12/11/2012    CBC    Component Value Date/Time   WBC 10.4 06/12/2013 0425   WBC 11.8* 06/06/2013 1005   RBC 4.46 06/12/2013 0425    RBC 4.79 06/06/2013 1005   HGB 13.9 06/12/2013 0425   HGB 13.9 06/06/2013 1005   HCT 38.9* 06/12/2013 0425   HCT 41.6 06/06/2013 1005   PLT 193 06/12/2013 0425   PLT 236 06/06/2013 1005   MCV 87.2 06/12/2013 0425   MCV 87.0 06/06/2013 1005   LYMPHSABS 0.5* 06/10/2013 2020   LYMPHSABS 0.7* 06/06/2013 1005   MONOABS 1.3* 06/10/2013 2020   MONOABS 1.1* 06/06/2013 1005   EOSABS 0.0 06/10/2013 2020   EOSABS 0.1 06/06/2013 1005   BASOSABS 0.1 06/10/2013 2020   BASOSABS 0.0 06/06/2013 1005    CMP     Component Value Date/Time   NA 131* 06/15/2013 0400   NA 132* 06/06/2013 1005   K 4.0 06/15/2013 0400  K 3.5 06/06/2013 1005   CL 97 06/15/2013 0400   CO2 21 06/15/2013 0400   CO2 20* 06/06/2013 1005   GLUCOSE 118* 06/15/2013 0400   GLUCOSE 128 06/06/2013 1005   BUN 23 06/15/2013 0400   BUN 14.5 06/06/2013 1005   CREATININE 1.21 06/15/2013 0400   CREATININE 0.8 06/06/2013 1005   CALCIUM 7.1* 06/15/2013 0400   CALCIUM 8.0* 06/06/2013 1005   PROT 5.5* 06/12/2013 0425   PROT 6.1* 06/06/2013 1005   ALBUMIN 1.9* 06/12/2013 0425   ALBUMIN 2.3* 06/06/2013 1005   AST 123* 06/12/2013 0425   AST 172 Repeated and Verified* 06/06/2013 1005   ALT 59* 06/12/2013 0425   ALT 75* 06/06/2013 1005   ALKPHOS 592* 06/12/2013 0425   ALKPHOS 769* 06/06/2013 1005   BILITOT 3.8* 06/12/2013 0425   BILITOT 3.00* 06/06/2013 1005   GFRNONAA 57* 06/15/2013 0400   GFRAA 66* 06/15/2013 0400    Assessment and Plan  Sepsis Patient was admitted to telemetry. He had tachycardia, tachypnea and leukocytosis. Chest x-ray showed medial left base infiltrate with underlying emphysema. He was started empirically on IV vancomycin and Zosyn. After 5 days, this was transitioned to oral levofloxacin on 3/19 (will also cover for bacteremia) and will complete a total 8 day course for this indication. Recommend repeating chest x-ray in 4-6 weeks to ensure resolution of pneumonia findings.   Moraxella bacteremia: Likely secondary to pneumonia. Discussed with  infectious disease M.D. on 3/19 who recommended transitioning from Zosyn to oral levofloxacin and complete total of 14 days treatment; BC from 3/19 early am are neg to date   Bilateral leg edema Pt has some chronic leg edema? Secondary to chronic venous stasis which was worsened recently-according to spouse thinks it is due to [place]. This could be multifactorial from metastatic liver disease, chronic venous insufficiency and poor oncotic pressure secondary to hypoalbuminemia. Despite IV diuresis, there is only mild improvement of his leg edema. Discussed with his primary oncologist today and we agree that we can transition him to oral diuretics and not be very aggressive which may cause other complications such as renal insufficiency. His creatinines are slightly higher today. Will also plan bilateral lower extremity compression stockings. His right leg, lateral aspect continues to have oozing of clear fluid and not convincing for cellulitis on exam. In any event, levofloxacin should cover. Bilateral lower extremity venous Dopplers were negative for DVT.   Healthcare-associated pneumonia Moraxella bacteremia: Likely secondary to pneumonia. Discussed with infectious disease M.D. on 3/19 who recommended transitioning from Zosyn to oral levofloxacin and complete total of 14 days treatment   Prostate cancer Advanced prostate cancer with metastatic disease to the bone and now possibly to the liver: Oncology consulted and indicated that his overall prognosis was poor and he was very skeptical about patient's ability to handle further systemic chemotherapy. He recommended palliative care consultation and hospice oriented care. Palliative care team evaluated and patient opted for DO NOT RESUSCITATE but wished to continue treatable conditions.  I have consulted PC   Anxiety Continue xanax, prn  Tachycardia NSVT x1: Seen on telemetry on 3/16. Repleted potassium and magnesium   Elevated TSH Elevated  TSH: Clinically looks euthyroid. Depending on how aggressive he or family wish to be, may consider repeating TSH in 4-6 weeks.   FTT (failure to thrive) in adult And weakness from CA, recent illness, age; SNF for OT/PT.    Hennie Duos, MD

## 2013-06-18 NOTE — Assessment & Plan Note (Signed)
Elevated TSH: Clinically looks euthyroid. Depending on how aggressive he or family wish to be, may consider repeating TSH in 4-6 weeks.

## 2013-06-18 NOTE — Assessment & Plan Note (Signed)
NSVT x1: Seen on telemetry on 3/16. Repleted potassium and magnesium

## 2013-06-18 NOTE — Assessment & Plan Note (Signed)
Continue xanax, prn

## 2013-06-18 NOTE — Assessment & Plan Note (Signed)
And weakness from CA, recent illness, age; SNF for OT/PT.

## 2013-06-18 NOTE — Assessment & Plan Note (Signed)
Advanced prostate cancer with metastatic disease to the bone and now possibly to the liver: Oncology consulted and indicated that his overall prognosis was poor and he was very skeptical about patient's ability to handle further systemic chemotherapy. He recommended palliative care consultation and hospice oriented care. Palliative care team evaluated and patient opted for DO NOT RESUSCITATE but wished to continue treatable conditions.  I have consulted PC

## 2013-06-18 NOTE — Assessment & Plan Note (Signed)
Moraxella bacteremia: Likely secondary to pneumonia. Discussed with infectious disease M.D. on 3/19 who recommended transitioning from Zosyn to oral levofloxacin and complete total of 14 days treatment

## 2013-06-18 NOTE — Assessment & Plan Note (Addendum)
Patient was admitted to telemetry. He had tachycardia, tachypnea and leukocytosis. Chest x-ray showed medial left base infiltrate with underlying emphysema. He was started empirically on IV vancomycin and Zosyn. After 5 days, this was transitioned to oral levofloxacin on 3/19 (will also cover for bacteremia) and will complete a total 8 day course for this indication. Recommend repeating chest x-ray in 4-6 weeks to ensure resolution of pneumonia findings.   Moraxella bacteremia: Likely secondary to pneumonia. Discussed with infectious disease M.D. on 3/19 who recommended transitioning from Zosyn to oral levofloxacin and complete total of 14 days treatment; BC from 3/19 early am are neg to date

## 2013-06-18 NOTE — Assessment & Plan Note (Signed)
Pt has some chronic leg edema? Secondary to chronic venous stasis which was worsened recently-according to spouse thinks it is due to [place]. This could be multifactorial from metastatic liver disease, chronic venous insufficiency and poor oncotic pressure secondary to hypoalbuminemia. Despite IV diuresis, there is only mild improvement of his leg edema. Discussed with his primary oncologist today and we agree that we can transition him to oral diuretics and not be very aggressive which may cause other complications such as renal insufficiency. His creatinines are slightly higher today. Will also plan bilateral lower extremity compression stockings. His right leg, lateral aspect continues to have oozing of clear fluid and not convincing for cellulitis on exam. In any event, levofloxacin should cover. Bilateral lower extremity venous Dopplers were negative for DVT.

## 2013-06-19 ENCOUNTER — Other Ambulatory Visit: Payer: Self-pay | Admitting: *Deleted

## 2013-06-19 MED ORDER — ALPRAZOLAM 0.25 MG PO TABS: ORAL_TABLET | ORAL | Status: AC

## 2013-06-19 NOTE — Telephone Encounter (Signed)
[facility]  

## 2013-06-20 LAB — CULTURE, BLOOD (ROUTINE X 2)
CULTURE: NO GROWTH
Culture: NO GROWTH

## 2013-06-22 ENCOUNTER — Encounter: Payer: Self-pay | Admitting: Oncology

## 2013-06-22 ENCOUNTER — Ambulatory Visit: Payer: Self-pay

## 2013-06-22 ENCOUNTER — Ambulatory Visit (HOSPITAL_BASED_OUTPATIENT_CLINIC_OR_DEPARTMENT_OTHER): Payer: Medicare Other | Admitting: Oncology

## 2013-06-22 VITALS — BP 137/92 | HR 89 | Temp 97.1°F | Resp 24

## 2013-06-22 DIAGNOSIS — C787 Secondary malignant neoplasm of liver and intrahepatic bile duct: Secondary | ICD-10-CM

## 2013-06-22 DIAGNOSIS — C7952 Secondary malignant neoplasm of bone marrow: Secondary | ICD-10-CM

## 2013-06-22 DIAGNOSIS — R000 Tachycardia, unspecified: Secondary | ICD-10-CM

## 2013-06-22 DIAGNOSIS — R110 Nausea: Secondary | ICD-10-CM

## 2013-06-22 DIAGNOSIS — C772 Secondary and unspecified malignant neoplasm of intra-abdominal lymph nodes: Secondary | ICD-10-CM

## 2013-06-22 DIAGNOSIS — C61 Malignant neoplasm of prostate: Secondary | ICD-10-CM

## 2013-06-22 DIAGNOSIS — R197 Diarrhea, unspecified: Secondary | ICD-10-CM

## 2013-06-22 DIAGNOSIS — C7951 Secondary malignant neoplasm of bone: Secondary | ICD-10-CM

## 2013-06-22 DIAGNOSIS — R609 Edema, unspecified: Secondary | ICD-10-CM

## 2013-06-22 NOTE — Progress Notes (Signed)
Hematology and Oncology Follow Up Visit  Donald Blair [id number] 1941-01-18 73 y.o. 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 His initial diagnosis was in 2005 with a Gleason score 3+4 equals 7 and a PSA of 7.  Prior Therapy:  1. He underwent external beam radiation with seed implants completed in July 2005. 2. He developed PSA recurrence in July 2010 but his prostate biopsy was negative at that time. 3. He started androgen deprivation February 2011 due to a rapid doubling of his PSA. 4. He received intermittent androgen deprivation in February 2012 until March 2013 her he developed a rise in his PSA indicating castration resistant disease. 5. He was treated as part of the STRIVT trial in May of 2013 after developing measurable disease with retroperitoneal lymphadenopathy and bone metastasis. 6. In January 2014 he developed further progression of his disease and was treated with Provenge immune therapy in April 2014. 7. Zytiga 1000 mg daily 11/14/12 through 03/27/13. Stopped due to rising PSA and edema.  Current therapy:  1. Xtandi 160 mg daily started on 03/2013 this was discontinued on 06/10/2013. 2. He receives Lupron at [facility]. Plans to begin receiving this here in April 2015. This will be discontinued on 06/22/2013. 3. He receives Niger monthly at [facility]. This will be given at the [place] starting on 04/27/2013. This will be discontinued on 06/22/2013.  Interim History:  Donald Blair is seen for  followup visit with his wife. He is a nice gentleman with the above diagnosis. Since his last visit, he was hospitalized between 3/15 on 06/15/2013. He presented with presumable pneumonia, cellulitis and failure to thrive. During this hospitalization, he was found to have increased lower extremity edema as  well as hepatic metastasis. I had multiple discussions with him and his wife while he is in the hospital and he is clearly declining in health and I did not feel that he is a candidate for any further therapy. He was discharged to a skilled nursing facility under hospice care. Since his discharge, he has continued to do poorly. He is for the most part wheelchair and bed bound. He continued to have increase in his lower extremity edema as well as but talks and back pain from laying in bed. His appetite and performance status continued to decline rapidly. Is not reporting any chest pain or shortness of breath but he does report dyspnea on any exertion. Is not reporting any hematuria or dysuria. Is not reporting any hemoptysis or hematemesis.    Medications: I have reviewed the patient's current medications.  Current Outpatient Prescriptions  Medication Sig Dispense Refill  . acetaminophen (TYLENOL) 325 MG tablet Take 2 tablets (650 mg total) by mouth every 6 (six) hours as needed for mild pain, moderate pain, fever or headache.      . ALPRAZolam (XANAX) 0.25 MG tablet Take one tablet by mouth twice daily as needed for anxiety  60 tablet  5  . calcium citrate-vitamin D (CITRACAL+D) 315-200 MG-UNIT per tablet Take 1 tablet by mouth 2 (two) times daily.      . furosemide (LASIX) 40 MG tablet Take 1.5 tablets (60 mg total) by mouth daily.      Marland Kitchen levofloxacin (LEVAQUIN) 750 MG tablet Take 1 tablet (750 mg total) by mouth daily. Discontinue after 06/22/2013 dose.      . Multiple Vitamins-Minerals (MULTIVITAMIN  WITH MINERALS) tablet Take 1 tablet by mouth daily.      . ondansetron (ZOFRAN) 8 MG tablet Take 1 tablet (8 mg total) by mouth every 12 (twelve) hours as needed for nausea.  30 tablet  1  . potassium chloride Blair (K-DUR,KLOR-CON) 20 MEQ tablet Take 1 tablet (20 mEq total) by mouth daily.  30 tablet  1  . spironolactone (ALDACTONE) 25 MG tablet Take 1 tablet (25 mg total) by mouth daily.       No  current facility-administered medications for this visit.     Allergies: No Known Allergies  Past Medical History, Surgical history, Social history, and Family History were reviewed and updated.  Review of Systems:  Remaining ROS negative.  Physical Exam: Blood pressure 137/92, pulse 89, temperature 97.1 F (36.2 C), temperature source Oral, resp. rate 24, weight 0 lb (0 kg). ECOG: 3 General appearance: alert, fatigued and mild distress. Ill-appearing. Head: Normocephalic, without obvious abnormality, atraumatic. His sclera appear slightly ectatic. Neck: no adenopathy, no carotid bruit, no JVD, supple, symmetrical, trachea midline and thyroid not enlarged, symmetric, no tenderness/mass/nodules Lymph nodes: Cervical adenopathy: negative, Axillary adenopathy: negative and Supraclavicular adenopathy: left supraclaviclar node appeared smaller and barely palpable to  Heart:regular rate and rhythm, S1, S2 normal, no murmur, click, rub or gallop. Tachycardic. Lung:chest clear, no wheezing, rales, normal symmetric air entry, no tachypnea, retractions or cyanosis Abdomen: soft, non-tender, without masses or organomegaly EXT: 3+ edema all the way up to the knee.   Lab Results: Lab Results  Component Value Date   WBC 10.4 06/12/2013   HGB 13.9 06/12/2013   HCT 38.9* 06/12/2013   MCV 87.2 06/12/2013   PLT 193 06/12/2013     Chemistry      Component Value Date/Time   NA 131* 06/15/2013 0400   NA 132* 06/06/2013 1005   K 4.0 06/15/2013 0400   K 3.5 06/06/2013 1005   CL 97 06/15/2013 0400   CO2 21 06/15/2013 0400   CO2 20* 06/06/2013 1005   BUN 23 06/15/2013 0400   BUN 14.5 06/06/2013 1005   CREATININE 1.21 06/15/2013 0400   CREATININE 0.8 06/06/2013 1005      Component Value Date/Time   CALCIUM 7.1* 06/15/2013 0400   CALCIUM 8.0* 06/06/2013 1005   ALKPHOS 592* 06/12/2013 0425   ALKPHOS 769* 06/06/2013 1005   AST 123* 06/12/2013 0425   AST 172 Repeated and Verified* 06/06/2013 1005   ALT 59*  06/12/2013 0425   ALT 75* 06/06/2013 1005   BILITOT 3.8* 06/12/2013 0425   BILITOT 3.00* 06/06/2013 1005          Impression and Plan:  This is a 73 year old gentleman with the following issues: 1. Castration resistant prostate cancer. He is currently approaching end-stage status. I've had these discussions with the patient and his wife on multiple occasions while he is in the hospital and they were repeated today. Is not a candidate for any further systemic therapy and has really very limited life expectancy. I recommended continuous hospice care and possible transfer to her residential hospice sooner rather than later.  2. Failure to thrive, and diarrhea and tachycardia: This is likely multifactorial but certainly end-stage prostate cancer is contributing at this time.  2. Androgen deprivation. This will be discontinued starting today's visit.  3. Bone directed therapy. This will be discontinued starting today's visit.  4. Nausea. Better since his recent hospitalization  5. Lower extremity edema. Seems to have gotten worse as of late. This is likely  a combination of poor nutritional status and hepatic metastasis. Is currently on diuretic given at the skilled nursing facility.  6. Enlarging left supraclavicular lymph node: Improved with radiation.  7. Followup. I anticipate that it will be very difficult for him to be seen as an outpatient setting moving forward. We will rely on hospice for updates.     UTMLYY,TKPTW 3/27/20159:54 AM

## 2013-06-22 NOTE — Progress Notes (Signed)
Referral to hospice/[facility], per dr Alen Blew.

## 2013-06-25 ENCOUNTER — Telehealth: Payer: Self-pay | Admitting: Medical Oncology

## 2013-06-25 NOTE — Telephone Encounter (Signed)
Jaci Lazier called to inform office that she spoke with patient's spouse and was informed that patient passed away yesterday at [facility]. MD informed.

## 2013-06-25 NOTE — Telephone Encounter (Signed)
Donald Blair, from [facility], called informing office that family would like to place patient at [facility]. Requesting last office note to be faxed.   Fax sent to number provided @ [phone], attn to [employer].   MD informed.

## 2013-06-27 DEATH — deceased

## 2014-07-07 IMAGING — CT CT ABD-PELV W/ CM
2 of 5 series · 16 of 46 positions shown, 18 images · IV contrast (OMNIPAQUE)
Comparison: CT abdomen pelvis 04/12/2012, bone scan 04/12/2012

CT CHEST

CLINICAL DATA: Prostate cancer diagnosed 3221.  Rising PSA

CT CHEST, ABDOMEN AND PELVIS WITH CONTRAST
TECHNIQUE: Multidetector CT imaging of the chest, abdomen and
pelvis was performed following the standard protocol during bolus
administration of intravenous contrast.
Contrast: 50mL OMNIPAQUE IOHEXOL 300 MG/ML  SOLN, 125mL OMNIPAQUE
IOHEXOL 300 MG/ML  SOLN

[Series 2: cap with st · axial · 0.94mm/px · z∈[-552,+64]mm · 13 of 139 slices shown, 15 images]
[im 8/139  soft-tissue]
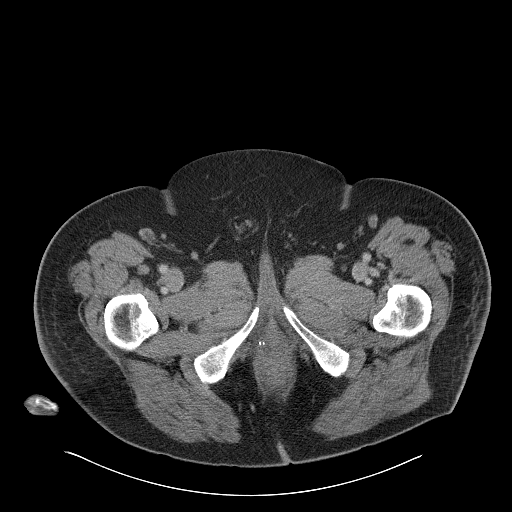
[im 8/139  bone]
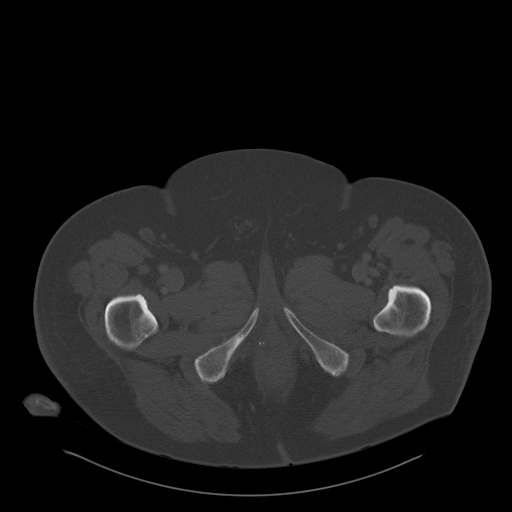
[im 22/139  soft-tissue]
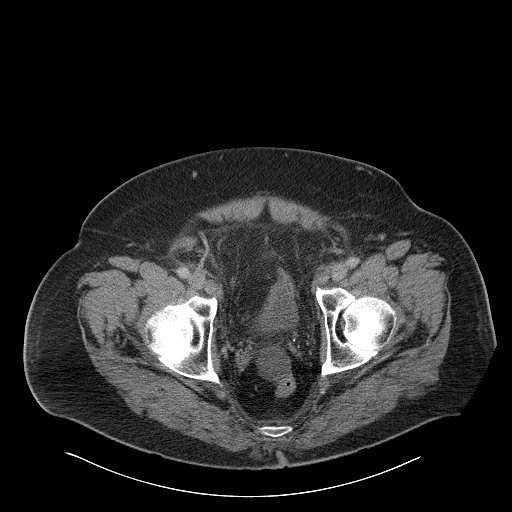
[im 30/139  soft-tissue]
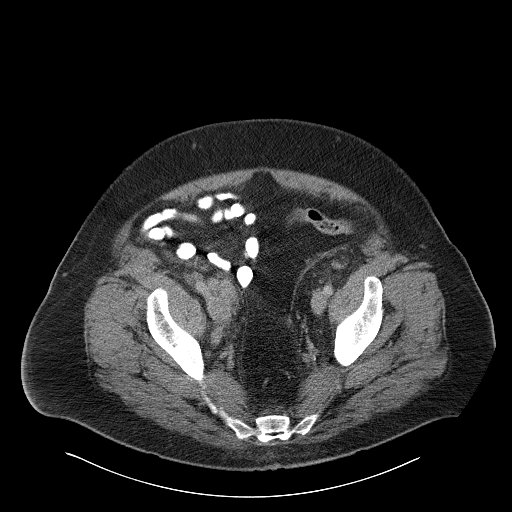
[im 37/139  soft-tissue]
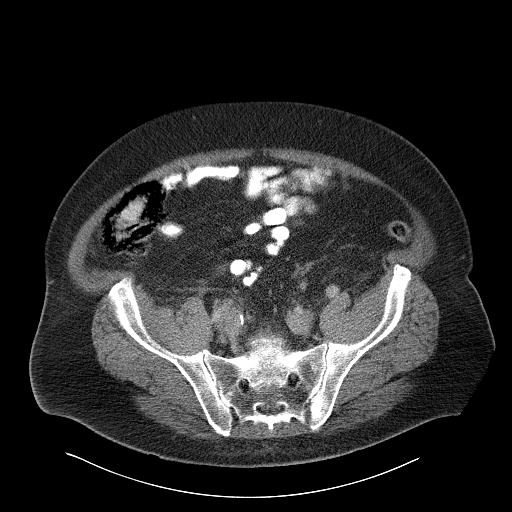
[im 51/139  soft-tissue]
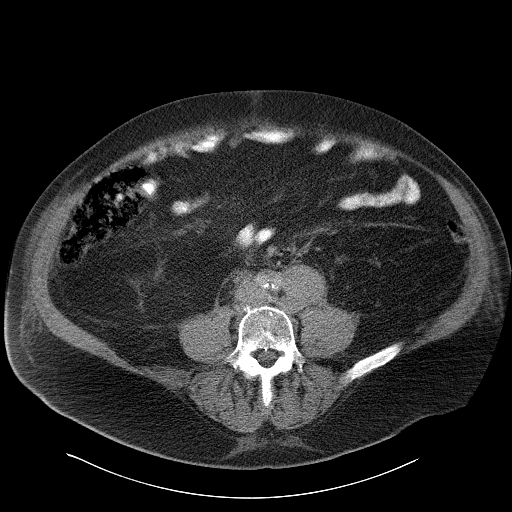
[im 59/139  soft-tissue]
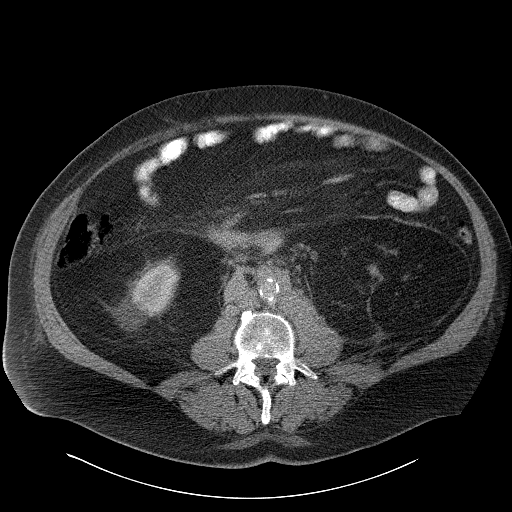
[im 73/139  soft-tissue]
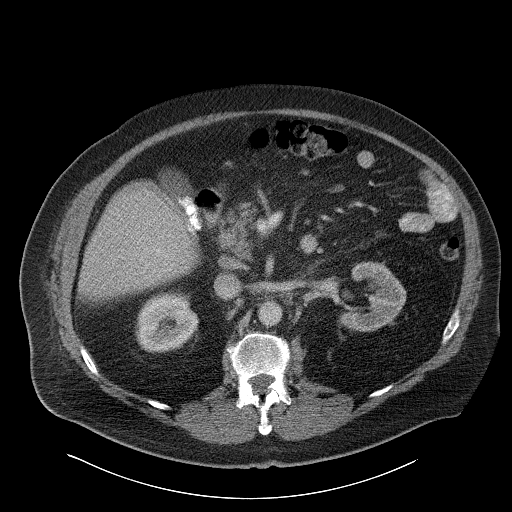
[im 80/139  soft-tissue]
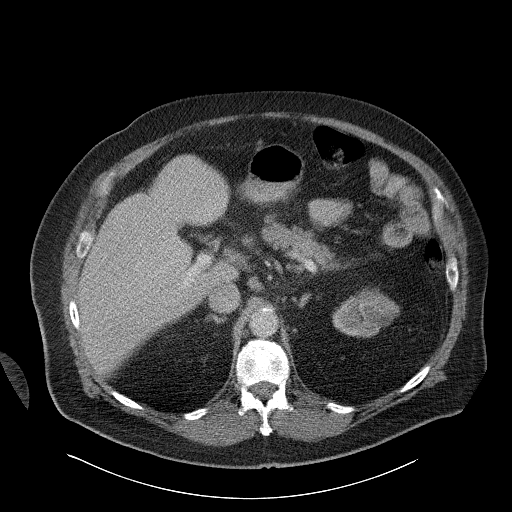
[im 88/139  soft-tissue]
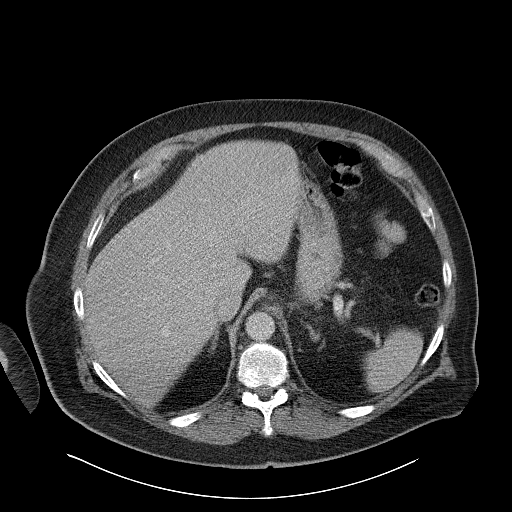
[im 88/139  bone]
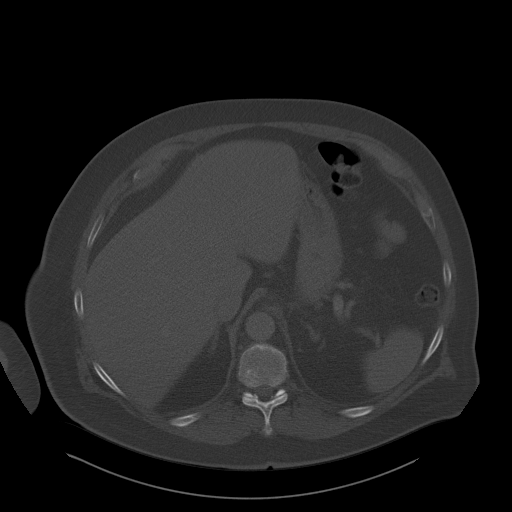
[im 102/139  soft-tissue]
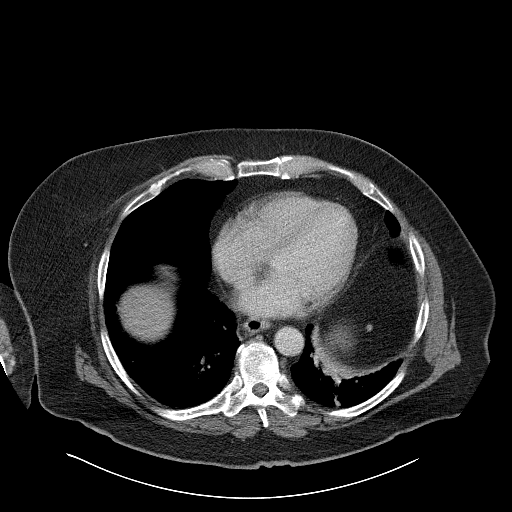
[im 109/139  soft-tissue]
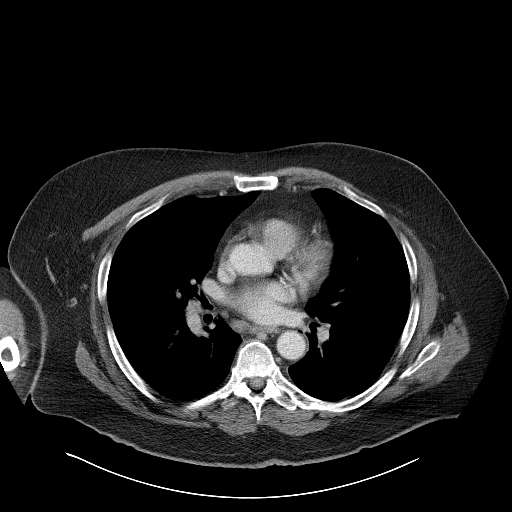
[im 117/139  soft-tissue]
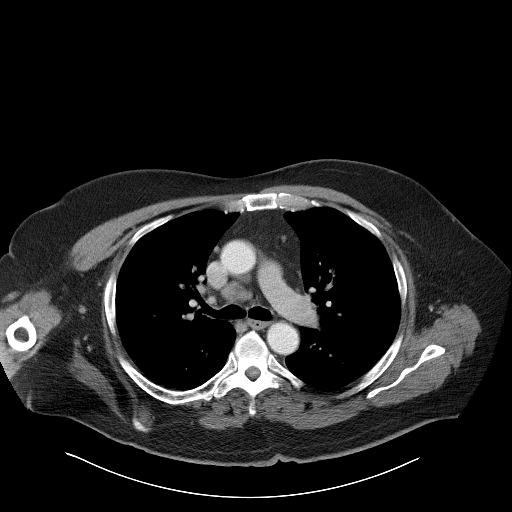
[im 131/139  soft-tissue]
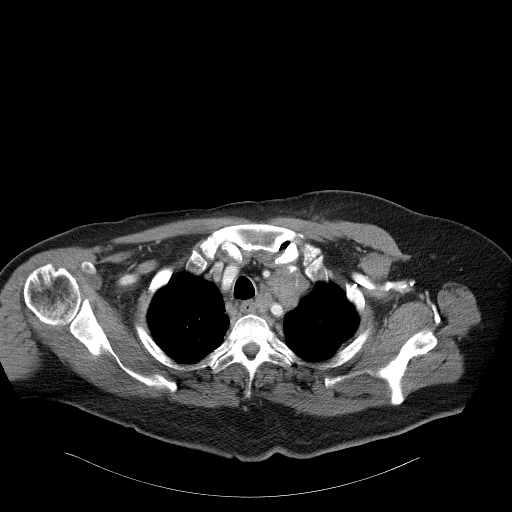

[Series 602: <mpr thick range> · coronal · 1.36mm/px · 3 of 117 slices shown]
[im 39/117  soft-tissue]
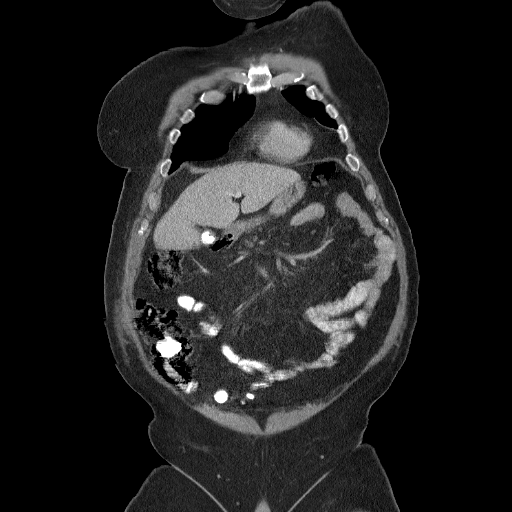
[im 52/117  soft-tissue]
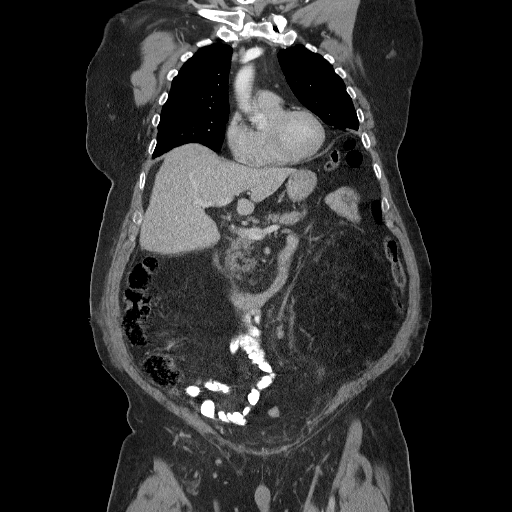
[im 65/117  soft-tissue]
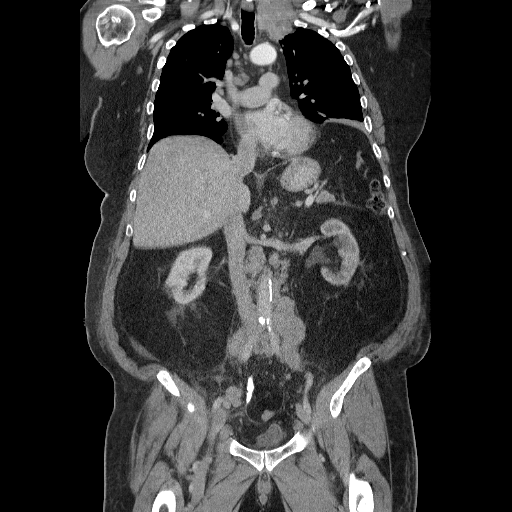

[16 of 46 positions shown; findings below may reference images not displayed]

FINDINGS: Large left supraclavicular node measuring 4.1 x 4.9 cm
which extends into the left upper mediastinum.  There is enlarged
right lower paratracheal lymph node measuring 20 mm short axis. The
prevascular metastatic node measures 12 mm.  Subcarinal lymph node
measures 11 mm.

Review of the lung parenchyma demonstrates an 8 mm nodule in the
inferior right middle lobe unchanged.  Small 2 mm right upper lobe
nodule (image 33)  is also unchanged.  There is atelectasis at the
left lung base which is new.
IMPRESSION: 1.  Bulky left supraclavicular metastatic adenopathy.
2.  Metastatic adenopathy in the mediastinum.
3..  Stable small pulmonary nodules.

CT ABDOMEN AND PELVIS
FINDINGS: The tiny subcapsular hypodensity in the right hepatic
lobe measures 8 mm (image 59).  Gallstones in the gallbladder.  The
pancreas, spleen, adrenal glands, kidneys are unchanged.
Nonenhancing cyst in the posterior aspect of the right kidney.

The stomach, small bowel, and colon unremarkable.

Abdominal aorta is normal caliber.

There is bulky retroperitoneal periaortic lymphadenopathy which is
increased significantly compared to prior.  For example aortocaval
lymph node measures the 2.5 cm (image 74) increased from 10 mm on
prior.  Lymph node left of the aorta at the level the bifurcation
measures 3.0 cm compared to mild to 2.2 cm on prior.

Right external iliac lymph node measuring 2.8 cm increased from
cm.  No significant inguinal lymphadenopathy.  Left common iliac
lymph node measures 2.5 cm (image 97) compared to 1.3 cm on prior.

No acute varices the prostate bed.  Bladder appears normal.

Review bone windows demonstrates increased sclerosis in the
posterior aspect of the left ilium (image 105).  Interval increase
in sclerosis of the T12 vertebral body.  There is sclerotic lesion
at T8 vertebral body which is new.  There is a mid sternal lesion
which compares to prior the bone scan.
IMPRESSION: 1..  Marked progression of periaortic retroperitoneal and iliac
lymphadenopathy.
2.  Progression of skeletal metastasis with a new  lesion at T8 and
increased sclerosis at T12.  Concern for new lesion in the left
ilium.
3..  Small hypodensity within the subcapsular right hepatic lobe.
Recommend attention on follow-up.

## 2014-09-23 ENCOUNTER — Other Ambulatory Visit: Payer: Self-pay

## 2015-01-31 IMAGING — US US ABDOMEN LIMITED
1 series · 14 of 25 positions shown · non-contrast
Comparison: CT chest, abdomen and pelvis 06/06/2013.

CLINICAL DATA: History of metastatic prostate cancer. Weakness.
Possible metastatic disease the liver by CT abdomen and pelvis.

EXAM:
US ABDOMEN LIMITED - RIGHT UPPER QUADRANT

[Series 1: us abdomen limited · 0.27mm/px · 14 of 55 slices shown]
[im 1/55]
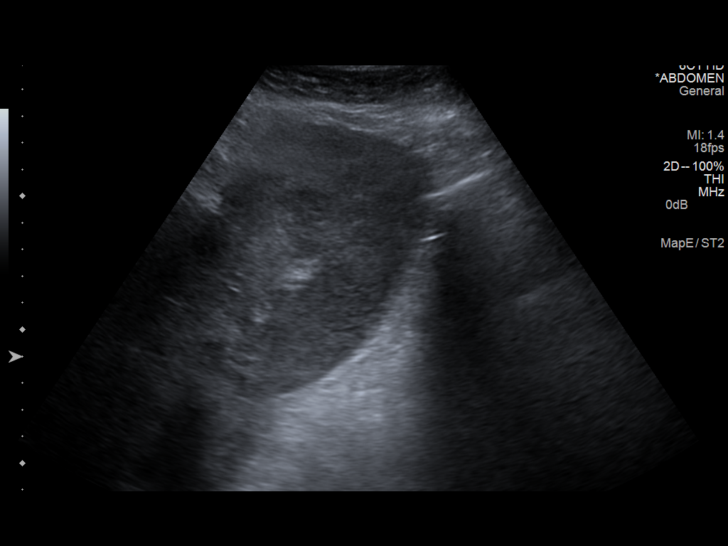
[im 5/55]
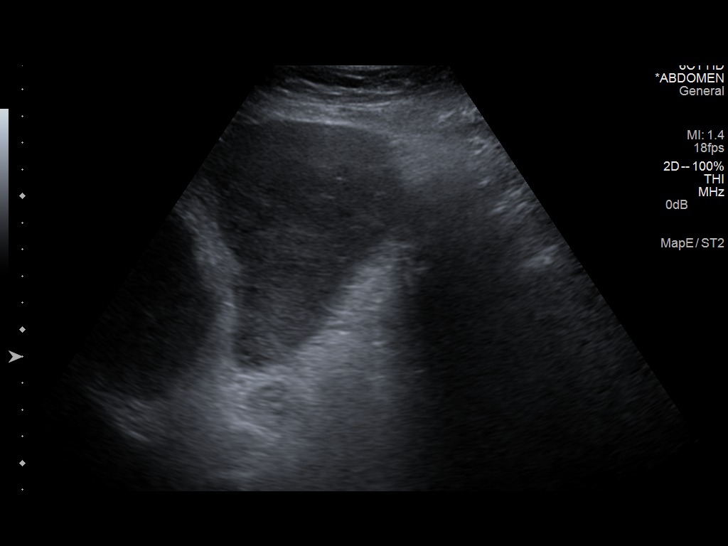
[im 10/55]
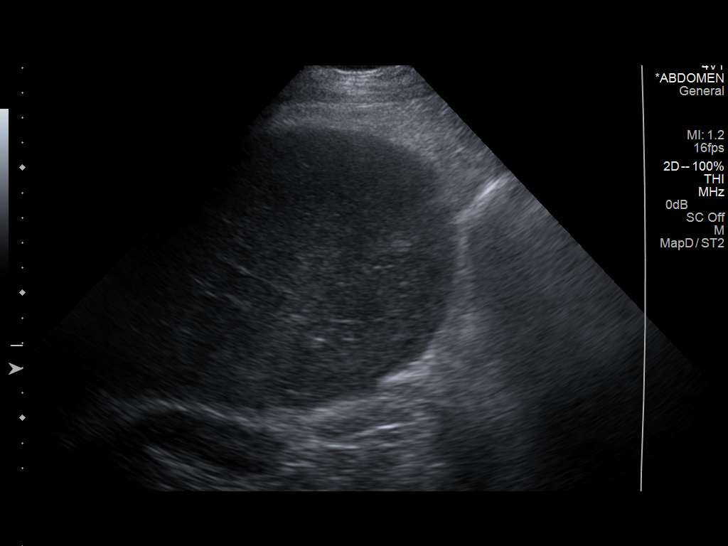
[im 14/55]
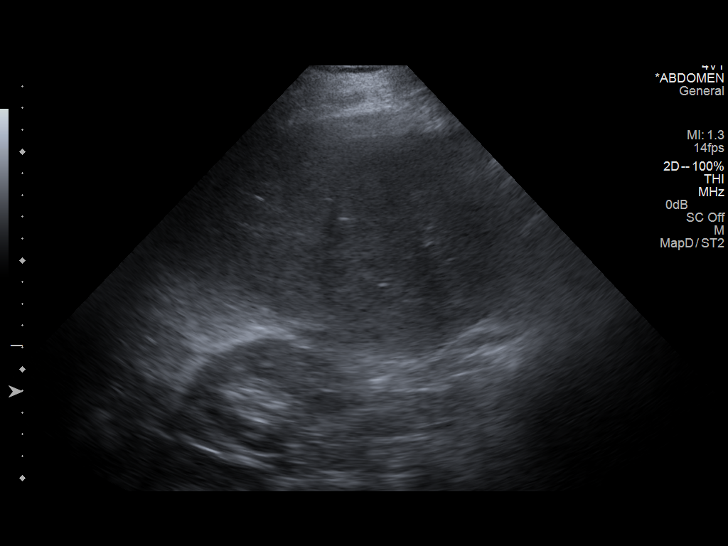
[im 19/55]
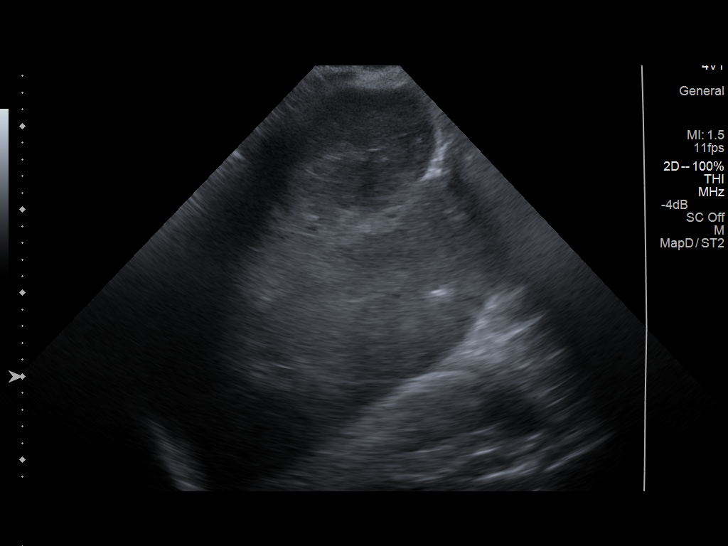
[im 21/55]
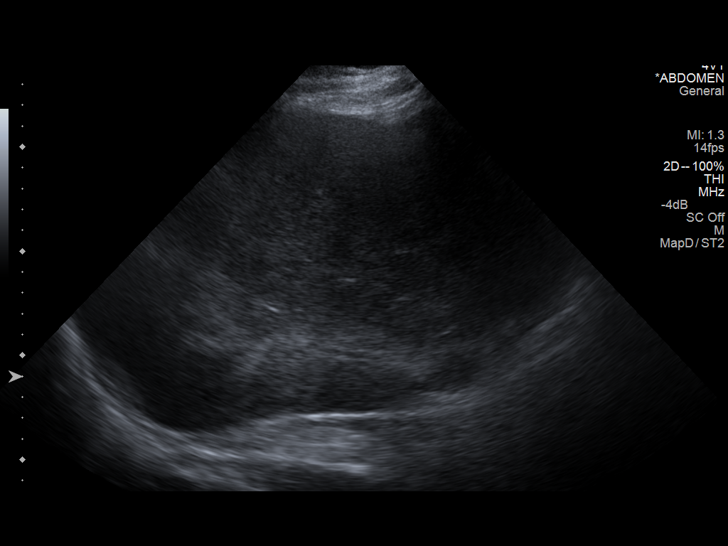
[im 25/55]
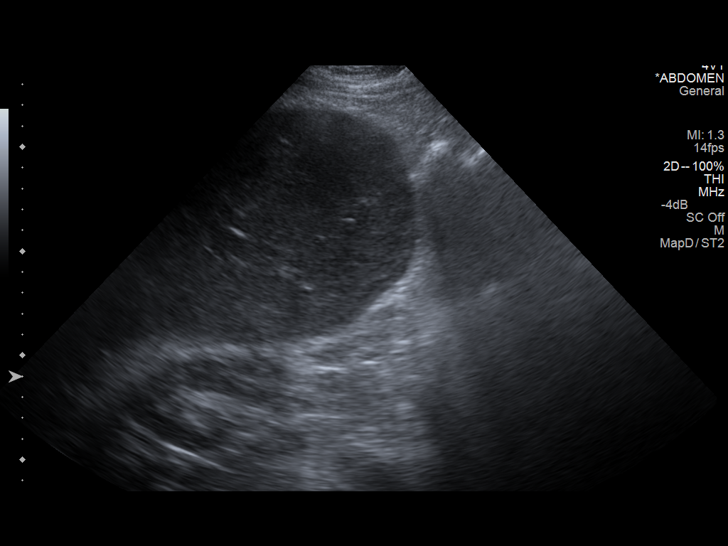
[im 30/55]
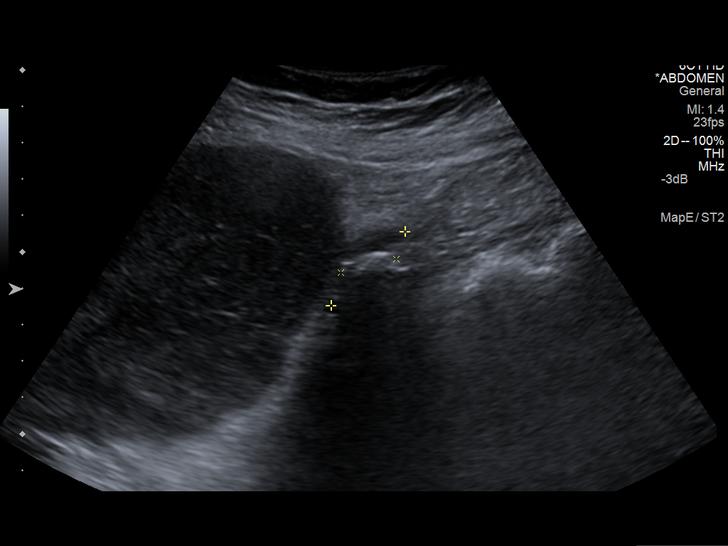
[im 34/55]
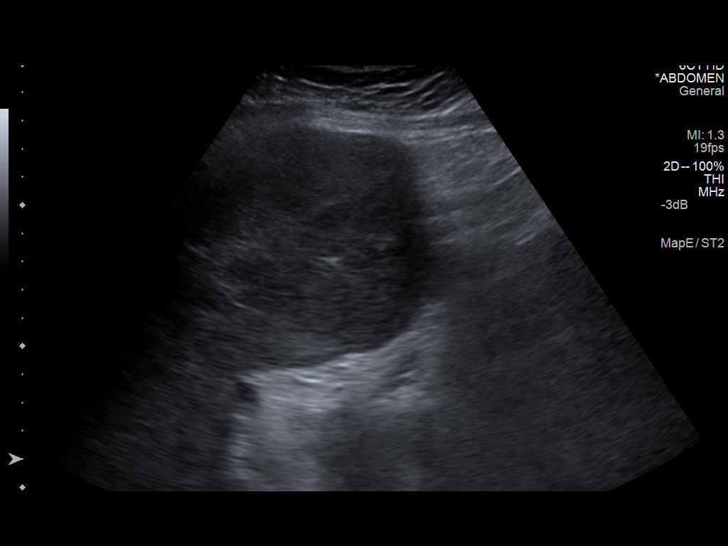
[im 37/55]
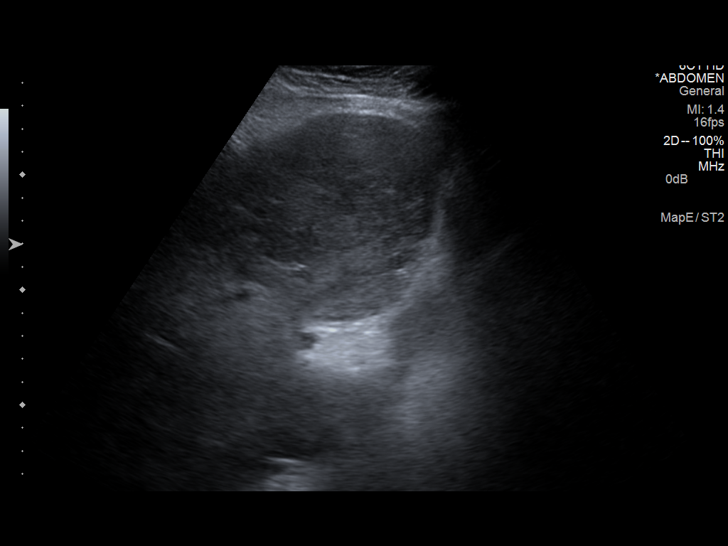
[im 41/55]
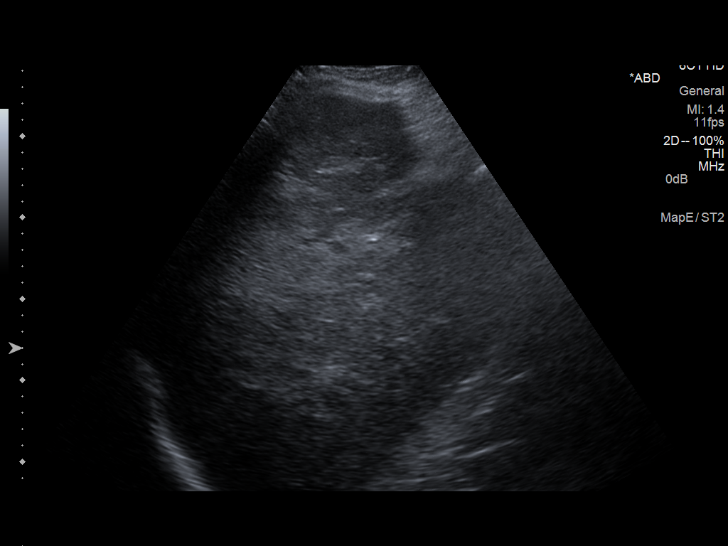
[im 46/55]
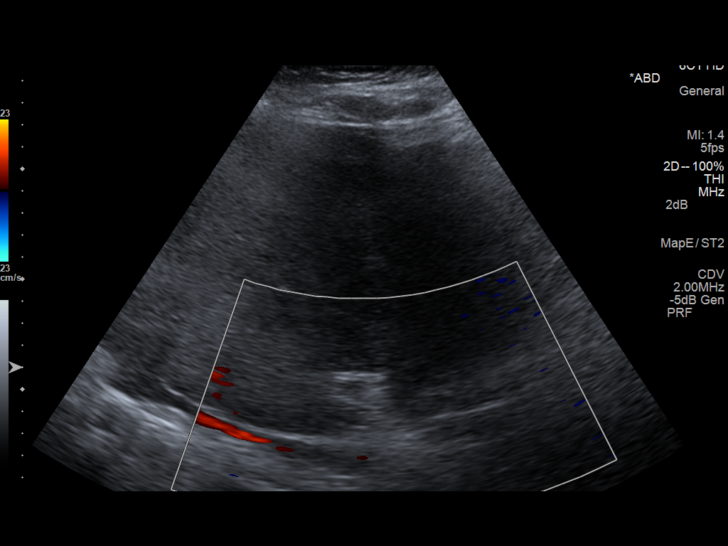
[im 50/55]
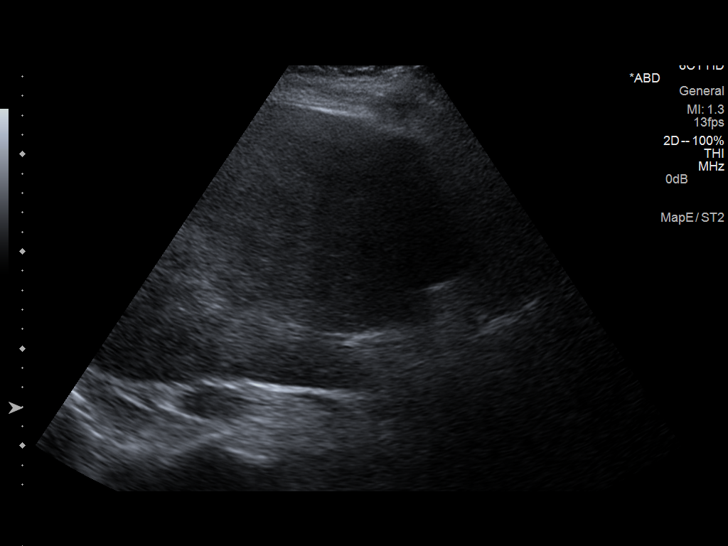
[im 55/55]
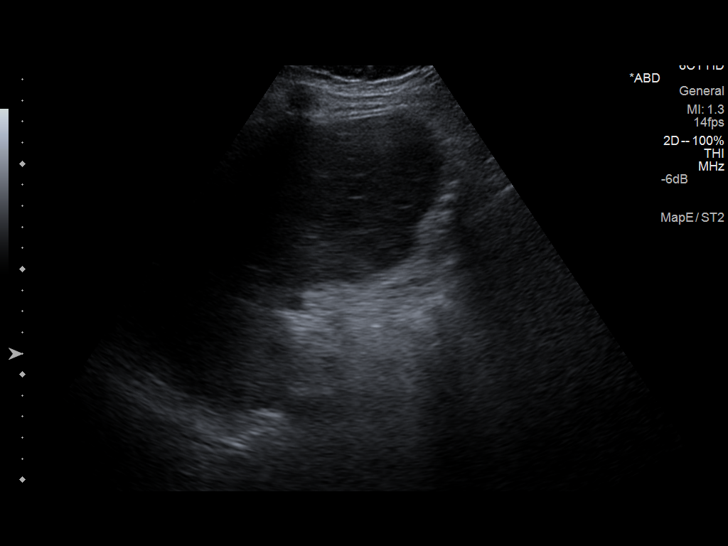

[14 of 25 positions shown; findings below may reference images not displayed]

FINDINGS: Gallbladder:

The gallbladder is contracted with stones present. No wall
thickening or pericholecystic fluid.

Common bile duct:

Diameter: 0.3 cm.

Liver:

As seen on CT scan, innumerable very large hypoechoic lesions are
present in the liver. No intrahepatic biliary ductal dilatation is
identified.
IMPRESSION: Multiple of hepatic lesions likely due to metastatic prostate
cancer. As noted on report of prior CT abdomen and pelvis, MRI could
be used for further evaluation.

Gallstones without evidence cholecystitis.

## 2021-06-28 ENCOUNTER — Encounter: Payer: Self-pay | Admitting: Oncology

## 2021-07-18 ENCOUNTER — Other Ambulatory Visit: Payer: Self-pay | Admitting: Nurse Practitioner
